# Patient Record
Sex: Female | Born: 1950 | Race: White | Hispanic: No | Marital: Married
Health system: Midwestern US, Community
[De-identification: ages and names within clinical notes are randomized; demographics above are authoritative.]

## PROBLEM LIST (undated history)

## (undated) DIAGNOSIS — N649 Disorder of breast, unspecified: Secondary | ICD-10-CM

## (undated) DIAGNOSIS — I10 Essential (primary) hypertension: Secondary | ICD-10-CM

## (undated) DIAGNOSIS — E785 Hyperlipidemia, unspecified: Secondary | ICD-10-CM

## (undated) DIAGNOSIS — C50912 Malignant neoplasm of unspecified site of left female breast: Principal | ICD-10-CM

## (undated) DIAGNOSIS — Z1231 Encounter for screening mammogram for malignant neoplasm of breast: Secondary | ICD-10-CM

## (undated) DIAGNOSIS — Z170 Estrogen receptor positive status [ER+]: Secondary | ICD-10-CM

---

## 2012-09-07 ENCOUNTER — Encounter

## 2012-09-22 NOTE — Patient Instructions (Signed)
Patient instructed to:  Bring picture ID, insurance card and proof of address  Dress comfortable  No jewelry  No makeup, lotions or powders  Can have a light breakfast the morning of surgery  Can take meds as usual the day of surgery  Patient instructed to arrive 09/27/2012 @ 1200  Verbalized understanding.  Michelle Pham

## 2012-09-27 MED ORDER — CEPHALEXIN 500 MG PO CAPS
500 MG | ORAL_CAPSULE | Freq: Four times a day (QID) | ORAL | Status: DC
Start: 2012-09-27 — End: 2013-10-11

## 2012-09-27 NOTE — Brief Op Note (Signed)
Brief Postoperative Note    Michelle Pham  Date of Birth:  1951/01/12  [id number]    Pre-operative Diagnosis: forehead bcc    Post-operative Diagnosis: Same    Procedure: excision and repair of forehead bcc    Anesthesia: Local    Surgeons/Assistants: K. Odaly Peri    Estimated Blood Loss: less than 50     Complications: None    Specimens: Was Obtained:     Findings:     Electronically signed by Prescott Gum, MD on 09/27/2012 at 2:14 PM

## 2012-09-27 NOTE — Progress Notes (Signed)
Sutures right forehead intact, no bleeding. Denies any pain.Discharge instructions reviewed with patient/responsible adult and understanding verbalized. Discharge instructions signed and copies given. Patient discharged home with belongings.

## 2012-09-27 NOTE — Plan of Care (Signed)
Pre-Operative:  1.  The patient is free from signs and symptoms of injury.  2.  The patient/caregiver participates in decisions affecting his or her Perioperative plan      of care.  3.  The patient's right to privacy is maintained.  4.  The Patient is the recipient of competent and ethical care within legal standards of      practice.  5.  The patient/caregiver has reduced anxiety.  Interventions - Familiarize with      environment and equipment.  6. Patient understands signs and symptoms of surgical site infections and verbalizes importance of hand hygiene.  7. The patient is assumed to be at increased risk of falling due to the procedure and medication use and appropriate interventions are implemented to prevent injuries related to falls.

## 2012-09-27 NOTE — Discharge Summary (Signed)
Discussed with the patient and all questioned fully answered. She will call me if any problems arise.

## 2012-09-27 NOTE — Op Note (Signed)
PATIENT NAME:                 PA #:            MR #Michelle Pham, Michelle Pham                  [id number]       [id number]            SURGEON:                              SURG DATE:  DIS DATE:          Prescott Gum, MD                  09/27/2012                     DATE OF BIRTH:   AGE:           PATIENT TYPE:     RM #:              [birth year]       61             SDA                                     PREOPERATIVE DIAGNOSIS:  Right forehead basal cell carcinoma.     POSTOPERATIVE DIAGNOSIS: Right forehead basal cell carcinoma.     OPERATIVE PROCEDURES:  1.  Excision of right forehead basal cell carcinoma 0.8 cm in diameter.  2.  Repair of right forehead defect 2.0 cm in length, complex.     ANESTHESIA:  Local.     ESTIMATED BLOOD LOSS:  Minimal.     COMPLICATIONS:  None.     STATUS AND DIAGNOSIS:  This approximately 62 year old female with a history  of a biopsy-proven basal cell of the right forehead.  She is being taken to  the operating room today for excision and repair.     OPERATIVE PROCEDURE:  The patient is brought to the operating room and the  lesion was identified with the patient and operating room staff.  An  elliptiform excision was planned around it.  Lidocaine 1% with 1:100,000  epinephrine was infiltrated widely around the margins of the lesion.  The  patient was prepped and draped in the usual sterile manner.  Previously  marked lines were incised and carried in the subcutaneous tissue.  The  specimen was elevated off the subcutaneous tissue. The specimen was oriented,  labeled and sent for frozen section. Frozen section was returned as showing  basal cell carcinoma with clear margins.  The defect was managed by  developing advancement flaps inferiorly and superiorly with sharp dissection  in the subcutaneous plane. Hemostasis was obtained with bipolar cautery.  Flaps were advanced into the center of the defect and secured with  interrupted 6-0 Vicryl. The skin was closed with running  6-0 fast absorbing  gut.  Excellent result was achieved.  The patient was awakened and returned  to the recovery room.  She tolerated the procedure well. There were no  obvious complications.  Prescott Gum, MD     [id number]  DD: 09/27/2012 14:27   DT: 09/27/2012 14:59   Job #: [id number]  CC: Prescott Gum, MD  CC: Laurette Schimke NASH, MD  CC: SHARON Marye Round, MD

## 2012-09-27 NOTE — Discharge Instructions (Signed)
Discharge Instructions:    Clean incision site with peroxide    Return to office in 5-7 days    Call office if any problems

## 2012-09-27 NOTE — Plan of Care (Signed)
Post-Operative:  1.  The patient is at or returning to pre-op pulmonary and cardiovascular status at the      conclusion of the immediate postoperative period.  2.  The patient is free from signs and symptoms of chemical, electrical, radiation,      positioning or transfer/transport injury.  3.  The patient/caregiver demonstrates knowledge of medication, pain, and wound      management.  4.  The patient/caregiver demonstrates knowledge of the expected responses to the      operative or invasive procedure.  5.  The patient demonstrates and/or reports adequate pain control throughout the      perioperative period.  6. Patient understands signs and symptoms of surgical site infections and verbalizes importance of hand hygiene.  7. The patient is assumed to be at increased risk of falling due to the procedure and medication use and appropriate interventions are implemented to prevent injuries related to falls.

## 2012-09-27 NOTE — H&P (Signed)
HISTORY & PHYSICAL FOR LOCAL CASES    History of present illness:  Forehead BCC    All allergies & meds reviewed  RELEVANT EXAMS:  Airway:  normal    Pulmonary: normal    Cardiovascular: normal    Abdominal: normal    Specific to procedure: Forehead BCC    I have reviewed with the patient and/or family the risks, benefits and alternatives to the procedure.  ASA Class:  1    The patient condition is acceptable for planned local anesthesia:

## 2012-09-27 NOTE — Progress Notes (Signed)
Pre and post operative expectations and routines explained to patient, verbalized understanding.

## 2013-02-03 MED ORDER — ANASTROZOLE 1 MG PO TABS
1 MG | ORAL_TABLET | Freq: Every day | ORAL | Status: DC
Start: 2013-02-03 — End: 2013-08-03

## 2013-02-03 NOTE — Telephone Encounter (Signed)
Rx e-scribed to requested pharmacy.

## 2013-02-04 NOTE — Telephone Encounter (Signed)
Michelle Pham  July 02, 1950  02/04/2013    Drug name: Anastrozole   Dose: 1 mg    Oral medications symptoms/toxicities assessed and addressed: Pt c/o hot flashes but states they are manageable and she does not require intervention at this point.    Oral Medication adherence assessed:    Patient knows the name and dose of the medication-yes  Patient knows proper schedule for taking medication-yes  Patient stores medication properly-yes  Patient has missed doses of the medication-no  Patient knows when a refill of the medication is due- yes  Patient is taking new medications-no      Oral medication education provided:    Patient instructed on dose/name of medication-yes  Patient instructed on prescribed schedule of medication-yes  Discussed proper storage and handling of the medication-yes  Discussed potential medication interactions with the patient-yes  Discussed when the next medication refill is needed-yes    Oral medication stopped:    Continues with oral therapy

## 2013-04-12 ENCOUNTER — Encounter

## 2013-06-10 NOTE — Progress Notes (Signed)
ONCOLOGY FOLLOW-UP:       Primary Oncologist: Tkai Large    PROBLEM LIST:       Patient Active Problem List    Diagnosis Date Noted   ??? 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     TREATMENT PLAN:  Excision, local radiation therapy, and anastrozole started for five years, April 2014 until April 2019.          Breast cancer ([place])    03/12/2012 Initial Diagnosis Breast cancer ([place])     INTERVAL HISTORY:       No complaints about anastrozole, but has gained weight in the winter due to over eating and lack of exercise. Hoping to be more active now that weather is better.  ROS: No HA, chest pain, SOB,  abd pain, skeletal pain. 10 point review is otherwise unremarkable.  PHYSICAL EXAM:       BP 122/80    Pulse 76    Temp(Src) 98.8 ??F (37.1 ??C) (Oral)    Resp 14    Wt 182 lb 3.2 oz (82.645 kg)     General appearance: alert and cooperative  Head: Normocephalic, without obvious abnormality, atraumatic  Neck: No palpable lymphadenopathy in supraclavicular or cervical chains  Lungs: Clear to auscultation bilaterally, no audible rales, wheezes or crackles  Heart: Regular rate and rhythm, S1, S2 normal  Breast:R-unremarkable L-excellent cosmetic result, no mass, axilla neg, FROM, no edema  Abdomen: Soft, non-tender; bowel sounds normal; no masses,  no organomegaly  Extremities: without cyanosis, clubbing, edema or asymmetry,FROM, no lymphedema  Skin: No jaundice, purpura or petechiae    LABS:     CBC:   No results found for this basename: WBC, NEUTROABS, HGB, MCV, PLT        BMP:   No results found for this basename: NA, K, CHLORIDE, CO2, GLU, BUN, CREATININE, MG, TSH       HEPATIC:  No results found for this basename: AST, ALT, ALKPHOS, PROT, ALB, BILITOT, BILIDIR, GGT, URICACID, LDH       TUMOR MARKERS:   No results found for this basename: PSA, CEA, CA125, CA2729, CA199         IMAGING:     No results found.    ASSESSMENT AND PLAN:         Encounter Diagnoses   Name Primary?   ??? Breast cancer ([place]) Yes     No orders of the defined types were placed in this encounter.   Encouraged to resume exercise.  Continue on anastrozole.  Sees Dr. Darreld Mclean yearly in Dec.  See me in 6 months.    Lorain Childes

## 2013-08-01 NOTE — Telephone Encounter (Signed)
Michelle Pham  07-22-1950  08/01/2013    Drug name: Anastrozole  Dose: 1 mg     Oral medications symptoms/toxicities assessed and addressed: Left a message for [name] to return a call to the office to discuss medication regimen.    Oral Medication adherence assessed:    Patient knows the name and dose of the medication  Patient knows proper schedule for taking medication  Patient stores medication properly  Patient has missed doses of the medication  Patient knows when a refill of the medication is due  Patient is taking new medications      Oral medication education provided:    Patient instructed on dose/name of medication-  Patient instructed on prescribed schedule of medication  Discussed proper storage and handling of the medication  Discussed potential medication interactions with the patient  Discussed when the next medication refill is needed    Oral medication stopped:    Patient continues with oral therapy unless otherwise notified.

## 2013-08-03 MED ORDER — ANASTROZOLE 1 MG PO TABS
1 MG | ORAL_TABLET | ORAL | Status: DC
Start: 2013-08-03 — End: 2014-01-10

## 2013-08-03 NOTE — Telephone Encounter (Signed)
Anastrozole refilled per request. Last office note indicated patient is to continue with medication regimen.

## 2013-09-02 LAB — POCT URINALYSIS DIPSTICK
Bilirubin, UA: NEGATIVE
Blood, UA POC: NEGATIVE
Glucose, UA POC: NEGATIVE
Leukocytes, UA: NEGATIVE
Nitrite, UA: NEGATIVE
Spec Grav, UA: 1.03
Urobilinogen, UA: NEGATIVE
pH, UA: 5

## 2013-09-02 MED ORDER — HYDROCODONE-ACETAMINOPHEN 5-325 MG PO TABS
5-325 MG | ORAL_TABLET | Freq: Four times a day (QID) | ORAL | Status: DC | PRN
Start: 2013-09-02 — End: 2013-10-11

## 2013-09-02 MED ORDER — METHOCARBAMOL 750 MG PO TABS
750 MG | ORAL_TABLET | Freq: Three times a day (TID) | ORAL | Status: DC | PRN
Start: 2013-09-02 — End: 2013-10-11

## 2013-09-02 MED ORDER — NAPROXEN 500 MG PO TABS
500 MG | ORAL_TABLET | Freq: Two times a day (BID) | ORAL | Status: DC | PRN
Start: 2013-09-02 — End: 2013-10-11

## 2013-09-02 MED ADMIN — ketorolac (TORADOL) injection 60 mg: 60 mg | INTRAMUSCULAR | @ 19:00:00

## 2013-09-02 NOTE — Patient Instructions (Signed)
RX: NAPROSYN, ROBAXIN, NORCO      Back Pain: After Your Visit  Your Care Instructions     Back pain has many possible causes. It is often related to problems with muscles and ligaments of the back. It may also be related to problems with the nerves, discs, or bones of the back. Moving, lifting, standing, sitting, or sleeping in an awkward way can strain the back. Sometimes you don't notice the injury until later. Arthritis is another common cause of back pain.  Although it may hurt a lot, back pain usually improves on its own within several weeks. Most people recover in 12 weeks or less. Using good home treatment and being careful not to stress your back can help you feel better sooner.  Follow-up care is a key part of your treatment and safety. Be sure to make and go to all appointments, and call your doctor if you are having problems. It???s also a good idea to know your test results and keep a list of the medicines you take.  How can you care for yourself at home?  ?? Sit or lie in positions that are most comfortable and reduce your pain. Try one of these positions when you lie down:  ?? Lie on your back with your knees bent and supported by large pillows.  ?? Lie on the floor with your legs on the seat of a sofa or chair.  ?? Lie on your side with your knees and hips bent and a pillow between your legs.  ?? Lie on your stomach if it does not make pain worse.  ?? Do not sit up in bed, and avoid soft couches and twisted positions. Bed rest can help relieve pain at first, but it delays healing. Avoid bed rest after the first day of back pain.  ?? Change positions every 30 minutes. If you must sit for long periods of time, take breaks from sitting. Get up and walk around, or lie in a comfortable position.  ?? Try using a heating pad on a low or medium setting for 15 to 20 minutes every 2 or 3 hours. Try a warm shower in place of one session with the heating pad.  ?? You can also try an ice pack for 10 to 15 minutes every 2 to  3 hours. Put a thin cloth between the ice pack and your skin.  ?? Take pain medicines exactly as directed.  ?? If the doctor gave you a prescription medicine for pain, take it as prescribed.  ?? If you are not taking a prescription pain medicine, ask your doctor if you can take an over-the-counter medicine.  ?? Take short walks several times a day. You can start with 5 to 10 minutes, 3 or 4 times a day, and work up to longer walks. Walk on level surfaces and avoid hills and stairs until your back is better.  ?? Return to work and other activities as soon as you can. Continued rest without activity is usually not good for your back.  ?? To prevent future back pain, do exercises to stretch and strengthen your back and stomach. Learn how to use good posture, safe lifting techniques, and proper body mechanics.  When should you call for help?  Call your doctor now or seek immediate medical care if:  ?? You have new or worsening numbness in your legs.  ?? You have new or worsening weakness in your legs. (This could make it hard to stand up.)  ??  You lose control of your bladder or bowels.  Watch closely for changes in your health, and be sure to contact your doctor if:  ?? Your pain gets worse.  ?? You are not getting better after 2 weeks.   Where can you learn more?   Go to [email or web address] and sign in to your MyChart account. Enter [phone] in the [place] box to learn more about ???Back Pain: After Your Visit.???    If you do not have an account, please click on the ???Sign Up Now??? link.     ?? 2006-2015 Healthwise, Incorporated. Care instructions adapted under license by [facility]. This care instruction is for use with your licensed healthcare professional. If you have questions about a medical condition or this instruction, always ask your healthcare professional. [place] any warranty or liability for your use of this information.  Content Version: 10.4.390249; Current  as of: February 11, 2013

## 2013-09-02 NOTE — Progress Notes (Addendum)
Subjective:      Patient ID: Michelle Pham is a 63 y.o. female.    Chief Complaint   Patient presents with   ??? Back Pain     pt states "back pain X 3 days, no known injury, just started hurting in lower back"       Back Pain  This is a recurrent problem. Episode onset: x 4 days. The problem occurs constantly. The problem has been waxing and waning since onset. The pain is present in the lumbar spine. The quality of the pain is described as aching (sharp with spasms at times). The pain is at a severity of 2/10 (at present, severe at times). The symptoms are aggravated by bending and position. Pertinent negatives include no abdominal pain, bladder incontinence, bowel incontinence, chest pain, dysuria, fever, headaches, leg pain, numbness, tingling, weakness or weight loss. Risk factors include history of cancer (history of breast cancer and skin cancer). She has tried NSAIDs and analgesics (Ibuprofen and Vicodin (leftover from another family member's Rx)) for the symptoms. The treatment provided no relief.       Review of Systems   Constitutional: Negative for fever, chills and weight loss.   Respiratory: Negative for cough, shortness of breath and wheezing.    Cardiovascular: Negative for chest pain.   Gastrointestinal: Negative for nausea, vomiting, abdominal pain, diarrhea and bowel incontinence.   Genitourinary: Negative for bladder incontinence, dysuria, urgency, frequency, hematuria and difficulty urinating.   Musculoskeletal: Positive for back pain.   Skin: Negative for rash.   Neurological: Negative for tingling, weakness, numbness and headaches.       Patient's medications, allergies, past medical, surgical, social and family histories were reviewed and updated as appropriate.    Objective:   Physical Exam   Constitutional: She is oriented to person, place, and time. She appears well-developed and well-nourished. She appears distressed (discomfort noted with position changes).   Neck: Normal range of motion.    Cardiovascular: Normal rate, regular rhythm and normal heart sounds.  Exam reveals no gallop and no friction rub.    No murmur heard.  Pulmonary/Chest: Effort normal and breath sounds normal. No respiratory distress. She has no decreased breath sounds. She has no wheezes. She has no rhonchi. She has no rales. She exhibits no tenderness.   Abdominal: Soft. Bowel sounds are normal. She exhibits no distension and no mass. There is no hepatosplenomegaly. There is no tenderness. There is no rebound and no guarding.   Musculoskeletal: She exhibits no edema.        Lumbar back: She exhibits decreased range of motion and tenderness. She exhibits no bony tenderness, no edema and no deformity.        Back:    Neurological: She is alert and oriented to person, place, and time. She has normal strength. No sensory deficit. Gait (antalgic) abnormal.   Reflex Scores:       Patellar reflexes are 2+ on the right side and 2+ on the left side.       Achilles reflexes are 2+ on the right side and 2+ on the left side.  5/5 strength both LE's, no sensory deficits, negative SLR's.   Skin: Skin is warm and dry. No lesion and no rash noted.   Psychiatric: She has a normal mood and affect. Her behavior is normal.   Vitals reviewed.      Assessment:      1. Low back pain  Lumbar Spine X-Ray    Interpreted by: me    Findings: No fracture, + mild lumbar scoliosis and disc space narrowing at L2/L3 and L3/L4 levels      Results for POC orders placed in visit on 09/02/13   POCT URINALYSIS DIPSTICK       Result Value Ref Range    Color, UA golden      Clarity, UA clear      Glucose, UA POC neg      Bilirubin, UA neg      Ketones, UA small      Spec Grav, UA 1.030      Blood, UA POC neg      pH, UA 5.0      Protein, UA POC +      Urobilinogen, UA neg      Leukocytes, UA neg      Nitrite, UA neg         Toradol 60 mg given IM    [facility] report reviewed.  Nothing listed over the past 12 months.      Plan:      Patient Instructions     RX:  NAPROSYN, ROBAXIN, NORCO      Back Pain: After Your Visit  Your Care Instructions     Back pain has many possible causes. It is often related to problems with muscles and ligaments of the back. It may also be related to problems with the nerves, discs, or bones of the back. Moving, lifting, standing, sitting, or sleeping in an awkward way can strain the back. Sometimes you don't notice the injury until later. Arthritis is another common cause of back pain.  Although it may hurt a lot, back pain usually improves on its own within several weeks. Most people recover in 12 weeks or less. Using good home treatment and being careful not to stress your back can help you feel better sooner.  Follow-up care is a key part of your treatment and safety. Be sure to make and go to all appointments, and call your doctor if you are having problems. It???s also a good idea to know your test results and keep a list of the medicines you take.  How can you care for yourself at home?  ?? Sit or lie in positions that are most comfortable and reduce your pain. Try one of these positions when you lie down:  ?? Lie on your back with your knees bent and supported by large pillows.  ?? Lie on the floor with your legs on the seat of a sofa or chair.  ?? Lie on your side with your knees and hips bent and a pillow between your legs.  ?? Lie on your stomach if it does not make pain worse.  ?? Do not sit up in bed, and avoid soft couches and twisted positions. Bed rest can help relieve pain at first, but it delays healing. Avoid bed rest after the first day of back pain.  ?? Change positions every 30 minutes. If you must sit for long periods of time, take breaks from sitting. Get up and walk around, or lie in a comfortable position.  ?? Try using a heating pad on a low or medium setting for 15 to 20 minutes every 2 or 3 hours. Try a warm shower in place of one session with the heating pad.  ?? You can also try an ice pack for 10 to 15 minutes every 2 to 3  hours. Put  a thin cloth between the ice pack and your skin.  ?? Take pain medicines exactly as directed.  ?? If the doctor gave you a prescription medicine for pain, take it as prescribed.  ?? If you are not taking a prescription pain medicine, ask your doctor if you can take an over-the-counter medicine.  ?? Take short walks several times a day. You can start with 5 to 10 minutes, 3 or 4 times a day, and work up to longer walks. Walk on level surfaces and avoid hills and stairs until your back is better.  ?? Return to work and other activities as soon as you can. Continued rest without activity is usually not good for your back.  ?? To prevent future back pain, do exercises to stretch and strengthen your back and stomach. Learn how to use good posture, safe lifting techniques, and proper body mechanics.  When should you call for help?  Call your doctor now or seek immediate medical care if:  ?? You have new or worsening numbness in your legs.  ?? You have new or worsening weakness in your legs. (This could make it hard to stand up.)  ?? You lose control of your bladder or bowels.  Watch closely for changes in your health, and be sure to contact your doctor if:  ?? Your pain gets worse.  ?? You are not getting better after 2 weeks.   Where can you learn more?   Go to [email or web address] and sign in to your MyChart account. Enter [phone] in the [facility] box to learn more about ???Back Pain: After Your Visit.???    If you do not have an account, please click on the ???Sign Up Now??? link.     ?? 2006-2015 Healthwise, Incorporated. Care instructions adapted under license by [facility]. This care instruction is for use with your licensed healthcare professional. If you have questions about a medical condition or this instruction, always ask your healthcare professional. [place] any warranty or liability for your use of this information.  Content Version: 10.4.390249; Current as  of: February 11, 2013                           09/06/2013 @ 0820:  Patient called and requested a refill on muscle relaxer.  I returned her call and she reports that she is feeling better today and has about 6 tablets remaining.  She will call back if she thinks she needs a refill.    Windy Fast, MD

## 2013-09-05 NOTE — Telephone Encounter (Signed)
Pt is concerned about running out of the muscle relaxer she was given on Friday. Stated she only has 4 left, has made an appt with a PCP but cannot get in till July. Wants to know if she can get a refill.

## 2013-09-12 NOTE — Telephone Encounter (Signed)
Patient called to rescheduled her follow up appointment with the provider prior to her leaving the practice. Patient rescheduled from 09.11.15 to 08.21.15.

## 2013-10-11 LAB — POCT URINALYSIS DIPSTICK
Bilirubin, UA: NEGATIVE
Blood, UA POC: NEGATIVE
Glucose, UA POC: NEGATIVE
Ketones, UA: NEGATIVE
Leukocytes, UA: NEGATIVE
Nitrite, UA: NEGATIVE
Protein, UA POC: NEGATIVE
Spec Grav, UA: 1.02
Urobilinogen, UA: NEGATIVE
pH, UA: 5

## 2013-10-11 MED ORDER — WELCHOL 3.75 G PO PACK
3.75 g | Freq: Every day | ORAL | Status: DC
Start: 2013-10-11 — End: 2013-10-18

## 2013-10-11 NOTE — Progress Notes (Signed)
[facility]  History and Physical  Elmo Putt, M.D.        Michelle Pham  Date of Birth:  [birth year]    Date of Service:  10/11/2013    Chief Complaint:   Michelle Pham is a 63 y.o. female who presents for complete physical examination.    HPI: Here to Establish and for Annual Physical.    She complain of flare of LBP last month and it's been getting better.  She's back mowing her yard and uses Aleve prn.     Treatment Adherence:   Medication compliance:  compliant most of the time  Diet compliance:  compliant all of the time  Weight trend: increasing  Current exercise: no regular exercise but she's on her feet all day with her yard work  Barriers: back pain  Hypertension:  Home blood pressure monitoring: Yes - 120/80 stable on Lisinopril 20 mg 1/2 qd.  She is adherent to a low sodium diet. Patient denies chest pain, shortness of breath, headache, lightheadedness, blurred vision, peripheral edema, palpitations, dry cough and fatigue.  Antihypertensive medication side effects: no medication side effects noted.  Use of agents associated with hypertension: NSAIDS.    Hyperlipidemia:  Unable to take statins due to myalgia.  Trying to watch diet and exercise daily      Wt Readings from Last 3 Encounters:   10/11/13 170 lb (77.111 kg)   09/02/13 166 lb (75.297 kg)   06/10/13 182 lb 3.2 oz (82.645 kg)     BP Readings from Last 3 Encounters:   10/11/13 144/80   09/02/13 128/62   06/10/13 122/80       Patient Active Problem List   Diagnosis   ??? Breast cancer ([place])   ??? Hyperlipidemia   ??? Hypertension       Allergies   Allergen Reactions   ??? Asa [Aspirin] Other (See Comments)     Causes stomach cramping     ??? Crestor [Rosuvastatin]      Myalgia   ??? Pravastatin      myalgia   ??? Zetia [Ezetimibe] Other (See Comments)     Muscle aches   ??? Zocor [Simvastatin] Other (See Comments)     Muscle aches     Outpatient Prescriptions Marked as Taking for the 10/11/13 encounter (Office Visit) with Quinn Plowman Birgitta Uhlir, MD   Medication Sig  Dispense Refill   ??? anastrozole (ARIMIDEX) 1 MG tablet TAKE ONE TABLET BY MOUTH DAILY  30 tablet  4   ??? lisinopril (PRINIVIL;ZESTRIL) 10 MG tablet Take 10 mg by mouth daily.           Past Medical History   Diagnosis Date   ??? Hyperlipidemia    ??? Hypertension    ??? Wears glasses    ??? Skin cancer 2014     right forehead.   ??? Breast cancer [facility])      radiation - left breast cancer     Past Surgical History   Procedure Laterality Date   ??? Breast lumpectomy Left 2014   ??? Appendectomy       as tenager   ??? Cesarean section       x 2   ??? Breast biopsy Left 2014     Family History   Problem Relation Age of Onset   ??? High Blood Pressure Mother    ??? Other Mother      colitis and perforated bowel   ??? Alzheimer's Disease Mother    ???  Irritable Bowel Syndrome Mother    ??? Alcohol Abuse Father    ??? High Cholesterol Brother    ??? High Blood Pressure Brother    ??? Heart Disease Maternal Uncle      heart attack    ??? Alzheimer's Disease Maternal Grandmother    ??? High Blood Pressure Maternal Grandmother    ??? Heart Disease Maternal Grandfather      heart attack    ??? Heart Disease Maternal Uncle      heart attack    ??? Anxiety Disorder Daughter    ??? Other Paternal Grandmother      die of perforated bowel   ??? High Blood Pressure Brother    ??? High Cholesterol Brother      History     Social History   ??? Marital Status: Married     Spouse Name: N/A     Number of Children: N/A   ??? Years of Education: N/A     Occupational History   ??? Not on file.     Social History Main Topics   ??? Smoking status: Never Smoker    ??? Smokeless tobacco: Never Used   ??? Alcohol Use: No      Comment:  seldom   ??? Drug Use: No   ??? Sexual Activity:     Partners: Male     Other Topics Concern   ??? Not on file     Social History Narrative       Review of Systems:  A comprehensive review of systems was negative except for what was noted in the HPI.     Physical Exam:   Filed Vitals:    10/11/13 1317 10/11/13 1324   BP: 152/82 144/80   Pulse: 78    Temp: 98.5 ??F (36.9 ??C)    Resp:  18    Height: 5' 6.5" (1.689 m)    Weight: 170 lb (77.111 kg)      Body mass index is 27.03 kg/(m^2).   Constitutional: She is oriented to person, place, and time. She appears well-developed and well-nourished. No distress.   HEENT:   Head: Normocephalic and atraumatic.   Right Ear: Tympanic membrane, external ear and ear canal normal.   Left Ear: Tympanic membrane, external ear and ear canal normal.   Mouth/Throat: Oropharynx is clear and moist, and mucous membranes are normal.  There is no cervical adenopathy.  Eyes: Conjunctivae and extraocular motions are normal. Pupils are equal, round, and reactive to light.   Neck: Supple. No JVD present. Carotid bruit is not present. No mass and no thyromegaly present.   Cardiovascular: Normal rate, regular rhythm, normal heart sounds and intact distal pulses.  Exam reveals no gallop and no friction rub.  No murmur heard.  Pulmonary/Chest: Effort normal and breath sounds normal. No respiratory distress. She has no wheezes, rhonchi or rales.   Abdominal: Soft, non-tender. Bowel sounds and aorta are normal. She exhibits no organomegaly, mass or bruit.   Musculoskeletal: Normal range of motion, no synovitis. She exhibits no edema.   Neurological: She is alert and oriented to person, place, and time. She has normal reflexes. No cranial nerve deficit. Coordination normal.   Skin: Skin is warm and dry. There is no rash or erythema.  No suspicious lesions noted.   Psychiatric: She has a normal mood and affect. Her speech is normal and behavior is normal. Judgment, cognition and memory are normal.     EKG Interpretation: Sinus  Rhythm  WITHIN NORMAL LIMITS.    Preventive Care:  Health Maintenance   Topic Date Due   ??? FLU VACCINE YEARLY (ADULT)  11/29/2013   ??? CERVICAL CANCER SCREENING  02/05/2014   ??? BREAST CANCER SCREENING  04/12/2014   ??? COLON CANCER SCREENING COLONOSCOPY  11/24/2014   ??? TETANUS VACCINE ADULT (11 YEARS AND UP)  12/09/2015   ??? ZOSTAVAX VACCINE  Completed      Hx  abnormal PAP: no  Sexual activity: has sex with males   Self-breast exams: yes  Last eye exam: 2015, normal  Exercise: walks 6 time(s) per week  Seatbelt use: yes       Preventive plan of care for Michelle Pham        10/11/2013           Preventive Measures Status       Recommendations for screening   Colon Cancer Screen   Colonoscopy Test is due 2016   Breast Cancer Screen  Mammogram Repeat yearly   Cervical Cancer Screen   PAP smear:  Test is due this year   Osteoporosis Screen   DEXA scan  Test is due 2016   Diabetes Screen  No results found for this basename: glucose    Test recommended and ordered  Repeat yearly   Cholesterol Screen  No results found for this basename: chol, trig, hdl, ldlcalc, ldldirect    Test recommended and ordered  Repeat yearly   Aspirin for Cardiovascular Prevention   No Not indicated   Weight: Body mass index is 27.03 kg/(m^2).  5' 6.5" (1.689 m)170 lb (77.111 kg)    Your BMI is 25 or greater, which indicates that you are overweight   Living Will: Yes   Copy requested    Recommended Immunizations    Immunization History   Administered Date(s) Administered   ??? Meningococcal Vaccine, unspecified formulation 08/29/2010   ??? Tdap (Boostrix, Adacel) 12/08/2005   ??? Zoster 08/29/2010        Influenza vaccine:  recommended every fall         Other Recommendations ??   See a dentist every 6 months  ?? Try to get at least 30 minutes of exercise 3-5 days per week  ?? Always wear a seat belt when traveling in a car  ?? Always wear a helmet when riding a bicycle or motorcycle  ?? When exposed to the sun, use a sunscreen that protects against both UVA and UVB radiation with an SPF of 30 or greater- reapply every 2 to 3 hours or after sweating, drying off with a towel, or swimming  ?? You need 1200-1500 mg of calcium and 1000-2000 IU of vitamin D per day- it is possible to meet your calcium requirement with diet alone, but a vitamin D supplement is usually necessary                 Assessment/Plan:    Michelle Pham  was seen today for established new doctor.    Diagnoses and associated orders for this visit:    Annual physical exam  - Lipid Panel; Future  - Comprehensive Metabolic Panel; Future  - CBC Auto Differential; Future  - POCT Urinalysis no Micro    Hyperlipidemia  - Lipid Panel; Future  Start colesevelam [facility]) 3.75 G PACK; Take 3.75 g by mouth daily    Hypertension  - POCT Urinalysis no Micro      Return Fasting 2 months.

## 2013-10-11 NOTE — Patient Instructions (Addendum)
Preventive plan of care for Trinty Marken        10/11/2013           Preventive Measures Status       Recommendations for screening   Colon Cancer Screen   Colonoscopy Test is due 2016   Breast Cancer Screen  Mammogram Repeat yearly   Cervical Cancer Screen   PAP smear:  Test is due this year   Osteoporosis Screen   DEXA scan  Test is due 2016   Diabetes Screen  No results found for this basename: glucose    Test recommended and ordered  Repeat yearly   Cholesterol Screen  No results found for this basename: chol, trig, hdl, ldlcalc, ldldirect    Test recommended and ordered  Repeat yearly   Aspirin for Cardiovascular Prevention   No Not indicated   Weight: Body mass index is 27.03 kg/(m^2).  5' 6.5" (1.689 m)170 lb (77.111 kg)    Your BMI is 25 or greater, which indicates that you are overweight   Living Will: Yes   Copy requested    Recommended Immunizations    Immunization History   Administered Date(s) Administered   ??? Meningococcal Vaccine, unspecified formulation 08/29/2010   ??? Tdap (Boostrix, Adacel) 12/08/2005   ??? Zoster 08/29/2010        Influenza vaccine:  recommended every fall         Other Recommendations ??   See a dentist every 6 months  ?? Try to get at least 30 minutes of exercise 3-5 days per week  ?? Always wear a seat belt when traveling in a car  ?? Always wear a helmet when riding a bicycle or motorcycle  ?? When exposed to the sun, use a sunscreen that protects against both UVA and UVB radiation with an SPF of 30 or greater- reapply every 2 to 3 hours or after sweating, drying off with a towel, or swimming  ?? You need 1200-1500 mg of calcium and 1000-2000 IU of vitamin D per day- it is possible to meet your calcium requirement with diet alone, but a vitamin D supplement is usually necessary               Return Fasting 2 months.

## 2013-10-13 LAB — CBC WITH AUTO DIFFERENTIAL
Basophils %: 0.2 %
Basophils Absolute: 0 10*3/uL (ref 0.0–0.2)
Eosinophils %: 5.6 %
Eosinophils Absolute: 0.4 10*3/uL (ref 0.0–0.6)
Hematocrit: 43.7 % (ref 36.0–48.0)
Hemoglobin: 13.9 g/dL (ref 12.0–16.0)
Lymphocytes %: 16.7 %
Lymphocytes Absolute: 1.3 10*3/uL (ref 1.0–5.1)
MCH: 31.4 pg (ref 26.0–34.0)
MCHC: 31.9 g/dL (ref 31.0–36.0)
MCV: 98.5 fL (ref 80.0–100.0)
MPV: 8.5 fL (ref 5.0–10.5)
Monocytes %: 6.7 %
Monocytes Absolute: 0.5 10*3/uL (ref 0.0–1.3)
Neutrophils %: 70.8 %
Neutrophils Absolute: 5.4 10*3/uL (ref 1.7–7.7)
Platelets: 377 10*3/uL (ref 135–450)
RBC: 4.43 M/uL (ref 4.00–5.20)
RDW: 14.1 % (ref 12.4–15.4)
WBC: 7.7 10*3/uL (ref 4.0–11.0)

## 2013-10-13 LAB — LIPID PANEL
Cholesterol, Total: 262 mg/dL — ABNORMAL HIGH (ref 0–199)
HDL: 60 mg/dL (ref 40–60)
LDL Calculated: 182 mg/dL — ABNORMAL HIGH (ref <100)
Triglycerides: 102 mg/dL (ref 0–150)
VLDL Cholesterol Calculated: 20 mg/dL

## 2013-10-13 LAB — COMPREHENSIVE METABOLIC PANEL
ALT: 13 U/L (ref 10–40)
AST: 19 U/L (ref 15–37)
Albumin/Globulin Ratio: 1.6 (ref 1.1–2.2)
Albumin: 4.5 g/dL (ref 3.4–5.0)
Alkaline Phosphatase: 71 U/L (ref 40–129)
Anion Gap: 15 (ref 3–16)
BUN: 20 mg/dL (ref 7–20)
CO2: 24 mmol/L (ref 21–32)
Calcium: 9.9 mg/dL (ref 8.3–10.6)
Chloride: 99 mmol/L (ref 99–110)
Creatinine: 0.9 mg/dL (ref 0.6–1.2)
GFR African American: >60 (ref >60)
GFR Non-African American: >60 (ref >60)
Globulin: 2.9 g/dL
Glucose: 90 mg/dL (ref 70–99)
Potassium: 4.9 mmol/L (ref 3.5–5.1)
Sodium: 138 mmol/L (ref 136–145)
Total Bilirubin: 0.6 mg/dL (ref 0.0–1.0)
Total Protein: 7.4 g/dL (ref 6.4–8.2)

## 2013-10-17 NOTE — Telephone Encounter (Signed)
Please advise?

## 2013-10-17 NOTE — Telephone Encounter (Signed)
Received call from patient stating that the [facility] she is prescribed has a high copay for her. She wanted to know as an alternative if ok with Dr. Willene Hatchet, could she take Cholestyramine (Questran) instead, It is more affordable. If ok, please call in script to [facility].

## 2013-10-18 ENCOUNTER — Encounter

## 2013-10-18 MED ORDER — QUESTRAN 4 G PO PACK
4 g | Freq: Two times a day (BID) | ORAL | Status: DC
Start: 2013-10-18 — End: 2013-11-14

## 2013-10-18 NOTE — Telephone Encounter (Signed)
Inform pt Questran has been sent.

## 2013-10-18 NOTE — Telephone Encounter (Signed)
Left message to call back on  voice mail.

## 2013-10-19 NOTE — Telephone Encounter (Signed)
Patient phoned the office back and was informed Michelle Pham was sent to the pharmacy.

## 2013-11-09 NOTE — Telephone Encounter (Signed)
Patient called to reschedule their Follow Up appointment. Appointment has been rescheduled for 11/11/2013.

## 2013-11-11 NOTE — Progress Notes (Signed)
ONCOLOGY FOLLOW-UP:       Primary Oncologist: Falana Clagg    PROBLEM LIST:       Patient Active Problem List    Diagnosis Date Noted   ??? Hyperlipidemia    ??? Hypertension    ??? 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     TREATMENT PLAN:  Excision, local radiation therapy, and anastrozole started for five years, April 2014 until April 2019.          Breast cancer ([place])    03/12/2012 Initial Diagnosis Breast cancer ([place])     INTERVAL HISTORY:       No complaints about anastrozole. Joint "pain" after working in the yard all day.  ROS: No HA, chest pain, SOB,  abd pain, skeletal pain. 10 point review is otherwise unremarkable.  PHYSICAL EXAM:       BP 110/70 mmHg   Pulse 84   Temp(Src) 98.3 ??F (36.8 ??C) (Oral)   Resp 16   Ht 5' 7"  (1.702 m)   Wt 170 lb 9.6 oz (77.384 kg)   BMI 26.71 kg/m2    General appearance: alert and cooperative  Head: Normocephalic, without obvious abnormality, atraumatic  Neck: No palpable lymphadenopathy in supraclavicular or cervical chains  Lungs: Clear to auscultation bilaterally, no audible rales, wheezes or crackles  Heart: Regular rate and rhythm, S1, S2 normal  Breast:R-unremarkable L-excellent cosmetic result, no mass, axilla neg, FROM, no edema  Abdomen: Soft, non-tender; bowel sounds normal; no masses,  no organomegaly  Extremities: without cyanosis, clubbing, edema or asymmetry,FROM, no lymphedema  Skin: No jaundice, purpura or petechiae    LABS:     CBC:   Lab Results   Component Value Date    WBC 7.7 10/12/2013       BMP:    Lab Results   Component Value Date    NA 138 10/12/2013       HEPATIC:  Lab Results   Component Value Date    AST 19 10/12/2013       TUMOR MARKERS:   No results found for this basename: PSA,  CEA,  CA125,  CA2729,  CA199         IMAGING:     No results found.    ASSESSMENT AND PLAN:         No diagnosis found.  No orders of the defined types were placed in this encounter.   Encouraged to continue exercise.  Continue on anastrozole.  Sees Dr. Darreld Mclean yearly in Dec.  RTC June 2016    Elenore Paddy

## 2013-11-14 MED ORDER — CHOLESTYRAMINE 4 G PO PACK
4 g | ORAL | Status: DC
Start: 2013-11-14 — End: 2013-12-09

## 2013-11-14 NOTE — Telephone Encounter (Signed)
Next office visit   12/08/2013

## 2013-12-08 NOTE — Progress Notes (Signed)
Subjective:      Patient ID: Michelle Pham is a 63 y.o. female.    HPI  Treatment Adherence:   Medication compliance: compliant most of the time   Diet compliance: compliant all of the time   Weight trend: increasing   Current exercise: no regular exercise but she's on her feet all day with her yard work   Barriers: back pain   Hypertension: Home blood pressure monitoring: Yes - 120/80 stable on Lisinopril 20 mg 1/2 qd. She is adherent to a low sodium diet. Patient denies chest pain, shortness of breath, headache, lightheadedness, blurred vision, peripheral edema, palpitations, dry cough and fatigue. Antihypertensive medication side effects: no medication side effects noted. Use of agents associated with hypertension: NSAIDS.   Hyperlipidemia: stable on Questran 4 g bid.  Unable to take statins due to myalgia. Trying to watch diet and exercise daily.    No results found for this basename: LABA1C, LABMICR     Lab Results   Component Value Date    NA 138 10/12/2013    K 4.9 10/12/2013    CL 99 10/12/2013    CO2 24 10/12/2013    BUN 20 10/12/2013    CREATININE 0.9 10/12/2013    GLUCOSE 90 10/12/2013    CALCIUM 9.9 10/12/2013     Lab Results   Component Value Date    CHOL 262 10/12/2013    TRIG 102 10/12/2013    HDL 60 10/12/2013    LDLCALC 182 10/12/2013     Lab Results   Component Value Date    ALT 13 10/12/2013    AST 19 10/12/2013     No results found for this basename: TSH, T29free, T11free     Lab Results   Component Value Date    WBC 7.7 10/12/2013    HGB 13.9 10/12/2013    HCT 43.7 10/12/2013    MCV 98.5 10/12/2013    PLT 377 10/12/2013     No results found for this basename: INR      No results found for this basename: PSA      No results found for this basename: LABURIC          Review of Systems  was negative except of what was stated on HPI      Objective:   Physical Exam  Nursing note and vitals reviewed.    Filed Vitals:    12/08/13 1055 12/08/13 1057   BP: 150/90 144/88   Pulse: 72    Temp: 98.6 ??F (37 ??C)    Resp: 18     Height: 5\' 7"  (1.702 m)    Weight: 169 lb (76.658 kg)      Wt Readings from Last 3 Encounters:   12/08/13 169 lb (76.658 kg)   11/11/13 170 lb 9.6 oz (77.384 kg)   10/11/13 170 lb (77.111 kg)     BP Readings from Last 3 Encounters:   12/08/13 144/88   11/11/13 110/70   10/11/13 144/80     Body mass index is 26.46 kg/(m^2).  Constitutional: Patient appears well-developed and well-nourished. No distress.   Head: Normocephalic and atraumatic.   Neck: Normal range of motion. Neck supple. No thyroidmegaly.   Cardiovascular: Normal rate, regular rhythm, normal heart sounds and intact distal pulses.   Pulmonary/Chest: Effort normal and breath sounds normal. No stridor. No respiratory distress. No wheezes and no rales.   Abdominal: Soft. Bowel sounds are normal. No distension and no mass. No tenderness. No rebound and  no guarding.   Musculoskeletal: No edema and no tenderness.   Skin: No rash or erythema.  Psychiatric: Normal mood and affect. Behavior is normal.       Assessment:      Noor was seen today for 3 month follow-up.    Diagnoses and associated orders for this visit:    Hyperlipidemia  - Hepatic Function Panel  - Lipid Panel    Essential hypertension    Need for prophylactic vaccination and inoculation against influenza  - Flu Vaccine greater than or = 64yo IM             Plan:      Return Fasting 6 months cholesterol 20 mins.

## 2013-12-08 NOTE — Patient Instructions (Signed)
Return Fasting 6 months cholesterol 20 mins.

## 2013-12-09 ENCOUNTER — Encounter

## 2013-12-09 LAB — LIPID PANEL
Cholesterol, Total: 199 mg/dL (ref 0–199)
HDL: 63 mg/dL — ABNORMAL HIGH (ref 40–60)
LDL Calculated: 116 mg/dL — ABNORMAL HIGH (ref <100)
Triglycerides: 100 mg/dL (ref 0–150)
VLDL Cholesterol Calculated: 20 mg/dL

## 2013-12-09 LAB — HEPATIC FUNCTION PANEL
ALT: 58 U/L — ABNORMAL HIGH (ref 10–40)
AST: 38 U/L — ABNORMAL HIGH (ref 15–37)
Albumin: 4.1 g/dL (ref 3.4–5.0)
Alkaline Phosphatase: 84 U/L (ref 40–129)
Bilirubin, Direct: <0.2 mg/dL (ref 0.0–0.3)
Total Bilirubin: 0.5 mg/dL (ref 0.0–1.0)
Total Protein: 7.1 g/dL (ref 6.4–8.2)

## 2013-12-09 MED ORDER — CHOLESTYRAMINE 4 G PO PACK
4 g | ORAL | Status: DC
Start: 2013-12-09 — End: 2014-01-07

## 2013-12-12 NOTE — Telephone Encounter (Signed)
Dr. Willene Hatchet ordered on Friday, September 11th.

## 2013-12-13 NOTE — Telephone Encounter (Signed)
Call pharmacy to see if a duplicate since Epic shows just ordered,  if they don't have the medicine in their system, can give verbal ok for the amount of refill since it's already in Epic.  In the meantime, this refill request will be refused as a duplicate.

## 2014-01-09 MED ORDER — CHOLESTYRAMINE 4 G PO PACK
4 g | ORAL | Status: DC
Start: 2014-01-09 — End: 2014-02-07

## 2014-01-09 NOTE — Telephone Encounter (Signed)
Inform pt to walk in for repeat liver test in a few weeks before out of the 30 day supply just sent in to make sure liver tests doesn't increase more before more refills can be given.

## 2014-01-09 NOTE — Telephone Encounter (Signed)
Left message on pt voicemail to call office back. Regarding- Inform pt to walk in for repeat liver test in a few weeks before out of the 30 day supply just sent in to make sure liver tests doesn't increase more before more refills can be given.

## 2014-01-10 ENCOUNTER — Encounter

## 2014-01-10 MED ORDER — ANASTROZOLE 1 MG PO TABS
1 MG | ORAL_TABLET | ORAL | Status: DC
Start: 2014-01-10 — End: 2014-05-31

## 2014-01-25 ENCOUNTER — Encounter: Admit: 2014-01-25 | Discharge: 2014-01-25 | Payer: PRIVATE HEALTH INSURANCE

## 2014-01-25 DIAGNOSIS — R7989 Other specified abnormal findings of blood chemistry: Secondary | ICD-10-CM

## 2014-01-26 LAB — HEPATIC FUNCTION PANEL
ALT: 19 U/L (ref 10–40)
AST: 24 U/L (ref 15–37)
Albumin: 4.5 g/dL (ref 3.4–5.0)
Alkaline Phosphatase: 74 U/L (ref 40–129)
Bilirubin, Direct: <0.2 mg/dL (ref 0.0–0.3)
Total Bilirubin: 0.9 mg/dL (ref 0.0–1.0)
Total Protein: 7 g/dL (ref 6.4–8.2)

## 2014-02-07 ENCOUNTER — Telehealth

## 2014-02-07 MED ORDER — LISINOPRIL 20 MG PO TABS
20 MG | ORAL_TABLET | Freq: Every day | ORAL | Status: DC
Start: 2014-02-07 — End: 2015-01-10

## 2014-02-07 MED ORDER — CHOLESTYRAMINE 4 G PO PACK
4 g | ORAL | Status: DC
Start: 2014-02-07 — End: 2014-06-08

## 2014-02-07 NOTE — Telephone Encounter (Signed)
questran  LAST REFILL 01/09/14  LAST AMOUNT 60 no refills   Last seen 12/08/2013  Next office visit   06/08/2014     Lisinopril- we have not filled before?

## 2014-02-07 NOTE — Telephone Encounter (Signed)
Patient needs 2 refills.  Lisinopril and cholestyralime oral susp.  Lisinopril needs to be written 20 mg and would like script for 90 days.  [place], [place]

## 2014-03-07 ENCOUNTER — Encounter

## 2014-03-07 ENCOUNTER — Inpatient Hospital Stay: Admit: 2014-03-07 | Attending: Surgery

## 2014-03-07 DIAGNOSIS — Z1231 Encounter for screening mammogram for malignant neoplasm of breast: Secondary | ICD-10-CM

## 2014-05-31 ENCOUNTER — Encounter

## 2014-05-31 MED ORDER — ANASTROZOLE 1 MG PO TABS
1 MG | ORAL_TABLET | ORAL | Status: DC
Start: 2014-05-31 — End: 2014-11-30

## 2014-06-08 ENCOUNTER — Ambulatory Visit: Admit: 2014-06-08 | Discharge: 2014-06-08 | Payer: MEDICAID | Attending: Internal Medicine

## 2014-06-08 ENCOUNTER — Encounter: Attending: Internal Medicine

## 2014-06-08 DIAGNOSIS — Z0000 Encounter for general adult medical examination without abnormal findings: Secondary | ICD-10-CM

## 2014-06-08 LAB — COMPREHENSIVE METABOLIC PANEL
ALT: 11 U/L (ref 10–40)
AST: 19 U/L (ref 15–37)
Albumin/Globulin Ratio: 1.3 (ref 1.1–2.2)
Albumin: 4.1 g/dL (ref 3.4–5.0)
Alkaline Phosphatase: 73 U/L (ref 40–129)
Anion Gap: 17 — ABNORMAL HIGH (ref 3–16)
BUN: 21 mg/dL — ABNORMAL HIGH (ref 7–20)
CO2: 24 mmol/L (ref 21–32)
Calcium: 9.4 mg/dL (ref 8.3–10.6)
Chloride: 100 mmol/L (ref 99–110)
Creatinine: 0.9 mg/dL (ref 0.6–1.2)
GFR African American: >60 (ref >60)
GFR Non-African American: >60 (ref >60)
Globulin: 3.2 g/dL
Glucose: 89 mg/dL (ref 70–99)
Potassium: 5 mmol/L (ref 3.5–5.1)
Sodium: 141 mmol/L (ref 136–145)
Total Bilirubin: 0.9 mg/dL (ref 0.0–1.0)
Total Protein: 7.3 g/dL (ref 6.4–8.2)

## 2014-06-08 LAB — LIPID PANEL
Cholesterol, Total: 254 mg/dL — ABNORMAL HIGH (ref 0–199)
HDL: 63 mg/dL — ABNORMAL HIGH (ref 40–60)
LDL Calculated: 172 mg/dL — ABNORMAL HIGH (ref <100)
Triglycerides: 95 mg/dL (ref 0–150)
VLDL Cholesterol Calculated: 19 mg/dL

## 2014-06-08 MED ORDER — COLESEVELAM HCL 3.75 G PO PACK
3.75 g | Freq: Every day | ORAL | Status: DC
Start: 2014-06-08 — End: 2015-01-10

## 2014-06-08 NOTE — Patient Instructions (Addendum)
Self- Management Goals for the Patient with High Cholesterol:    It is important to have goals to work towards when you have High Cholesterol.    Below is a list of goals your doctor would like you to work towards to help control your hyperlipidemia and also maintain and improve your overall health.    Please select one of these goals to try before your next follow up visit:    Goal: I will schedule my follow-up appointment for high cholesterol at checkout desk today. I will keep the appointment or, if necessary, reschedule within a reasonable timeframe.    Guess your barriers before they happen. Everyone runs into barriers to their goals. You may already know what's going to get in your way. Write down these problems (cost? time? stress? fear?), and think of ways to get around them.     Barriers to success: medication side effects  Plan for overcoming my barriers: N/A     Confidence: 8/10  Date goal set: 06/08/14  Date goal attained:       Preventive plan of care for Michelle Pham        06/08/2014           Preventive Measures Status       Recommendations for screening   Colon Cancer Screen   Colonoscopy Test is due 10/2014   Breast Cancer Screen  Mammogram Repeat yearly   Cervical Cancer Screen   PAP smear:  Test is due later 2016   Osteoporosis Screen   DEXA scan  Test is due 2017   Diabetes Screen  GLUCOSE (mg/dL)   Date Value   10/12/2013 90    Repeat yearly   Cholesterol Screen  Lab Results   Component Value Date    CHOL 199 12/08/2013    TRIG 100 12/08/2013    HDL 63* 12/08/2013    LDLCALC 116* 12/08/2013    Test recommended and ordered   Aspirin for Cardiovascular Prevention   No Not indicated   Weight: Body mass index is 27.4 kg/(m^2).  5\' 7"  (1.702 m)175 lb (79.379 kg)    Your BMI is 25 or greater, which indicates that you are overweight   Living Will: Yes   Copy requested    Recommended Immunizations    Immunization History   Administered Date(s) Administered   ??? Influenza Virus Vaccine 12/08/2013   ???  Meningococcal Vaccine, unspecified formulation 08/29/2010   ??? Tdap (Boostrix, Adacel) 12/08/2005   ??? Zoster 08/29/2010        Influenza vaccine:  recommended every fall         Other Recommendations ??   See a dentist every 6 months  ?? Try to get at least 30 minutes of exercise 3-5 days per week  ?? Always wear a seat belt when traveling in a car  ?? Always wear a helmet when riding a bicycle or motorcycle  ?? When exposed to the sun, use a sunscreen that protects against both UVA and UVB radiation with an SPF of 30 or greater- reapply every 2 to 3 hours or after sweating, drying off with a towel, or swimming  ?? You need 1200-1500 mg of calcium and 1000-2000 IU of vitamin D per day- it is possible to meet your calcium requirement with diet alone, but a vitamin D supplement is usually necessary                 Return Fasting 1 month if on med AND Fasting  Welcome to [employer]

## 2014-06-08 NOTE — Progress Notes (Signed)
No components found for: CHLPL  Lab Results   Component Value Date    TRIG 100 12/08/2013     Lab Results   Component Value Date    HDL 63* 12/08/2013     Lab Results   Component Value Date    LDLCALC 116* 12/08/2013     Lab Results   Component Value Date    LABVLDL 20 12/08/2013       Lab Results   Component Value Date    ALT 19 01/25/2014    AST 24 01/25/2014    ALKPHOS 74 01/25/2014    BILITOT 0.9 01/25/2014           Is patient currently taking any cholesterol medications?     Yes   If yes, see med list as above    Is the patient reporting any side effects of cholesterol medications?   Yes    Is the patient taking any over the counter medications?    Yes   If yes, see med list as above    Is the patient taking a daily aspirin?    No

## 2014-06-08 NOTE — Progress Notes (Signed)
[facility]  History and Physical  Elmo Putt, M.D.        Michelle Pham  Date of Birth:  1950/09/08    Date of Service:  06/08/2014    Chief Complaint:   Michelle Pham is a 64 y.o. female who presents for complete physical examination.    HPI: Here for Annual Physical with new insurance and Follow up.    Treatment Adherence:   Medication compliance: compliant most of the time   Diet compliance: compliant all of the time   Weight trend: increasing   Current exercise: no regular exercise but she's on her feet all day with her yard work   Barriers: back pain   Hypertension: Home blood pressure monitoring: Yes - 120/80 stable on Lisinopril 20 mg 1/2 qd. She is adherent to a low sodium diet. Patient denies chest pain, shortness of breath, headache, lightheadedness, blurred vision, peripheral edema, palpitations, dry cough and fatigue. Antihypertensive medication side effects: no medication side effects noted. Use of agents associated with hypertension: NSAIDS.   Hyperlipidemia: off Questran 4 g bid due to indigestion. Unable to take statins due to myalgia. Trying to watch diet and exercise daily.      No results found for: LABA1C, LABMICR  Lab Results   Component Value Date    NA 138 10/12/2013    K 4.9 10/12/2013    CL 99 10/12/2013    CO2 24 10/12/2013    BUN 20 10/12/2013    CREATININE 0.9 10/12/2013    GLUCOSE 90 10/12/2013    CALCIUM 9.9 10/12/2013     Lab Results   Component Value Date    CHOL 199 12/08/2013    TRIG 100 12/08/2013    HDL 63 12/08/2013    LDLCALC 116 12/08/2013     Lab Results   Component Value Date    ALT 19 01/25/2014    AST 24 01/25/2014     No results found for: TSH, T4FREE  Lab Results   Component Value Date    WBC 7.7 10/12/2013    HGB 13.9 10/12/2013    HCT 43.7 10/12/2013    MCV 98.5 10/12/2013    PLT 377 10/12/2013     No results found for: INR   No results found for: PSA   No results found for: LABURIC       Wt Readings from Last 3 Encounters:   06/08/14 175 lb (79.379 kg)    12/08/13 169 lb (76.658 kg)   11/11/13 170 lb 9.6 oz (77.384 kg)     BP Readings from Last 3 Encounters:   06/08/14 138/86   12/08/13 144/88   11/11/13 110/70       Patient Active Problem List   Diagnosis   ??? Breast cancer ([place])   ??? Hyperlipidemia, unspecified   ??? Essential hypertension       Allergies   Allergen Reactions   ??? Asa [Aspirin] Other (See Comments)     Causes stomach cramping     ??? Crestor [Rosuvastatin]      Myalgia   ??? Pravastatin      myalgia   ??? Questran [Cholestyramine]      indigestion   ??? Zetia [Ezetimibe] Other (See Comments)     Muscle aches   ??? Zocor [Simvastatin] Other (See Comments)     Muscle aches     Outpatient Prescriptions Marked as Taking for the 06/08/14 encounter (Office Visit) with Quinn Plowman Nathali Vent, MD   Medication Sig Dispense Refill   ???  anastrozole (ARIMIDEX) 1 MG tablet TAKE ONE TABLET BY MOUTH DAILY 30 tablet 4   ??? lisinopril (PRINIVIL;ZESTRIL) 20 MG tablet Take 1 tablet by mouth daily 90 tablet 1       Past Medical History   Diagnosis Date   ??? Hyperlipidemia    ??? Hypertension    ??? Wears glasses    ??? Skin cancer 2014     right forehead.   ??? Breast cancer [facility])      radiation - left breast cancer     Past Surgical History   Procedure Laterality Date   ??? Breast lumpectomy Left 2014   ??? Appendectomy       as tenager   ??? Cesarean section       x 2   ??? Breast biopsy Left 2014     Family History   Problem Relation Age of Onset   ??? High Blood Pressure Mother    ??? Other Mother      colitis and perforated bowel   ??? Alzheimer's Disease Mother    ??? Irritable Bowel Syndrome Mother    ??? Alcohol Abuse Father    ??? High Cholesterol Brother    ??? High Blood Pressure Brother    ??? Heart Disease Maternal Uncle      heart attack    ??? Alzheimer's Disease Maternal Grandmother    ??? High Blood Pressure Maternal Grandmother    ??? Heart Disease Maternal Grandfather      heart attack    ??? Heart Disease Maternal Uncle      heart attack    ??? Anxiety Disorder Daughter    ??? Other Paternal Grandmother      die of  perforated bowel   ??? High Blood Pressure Brother    ??? High Cholesterol Brother      History     Social History   ??? Marital Status: Married     Spouse Name: N/A     Number of Children: N/A   ??? Years of Education: N/A     Occupational History   ??? Not on file.     Social History Main Topics   ??? Smoking status: Never Smoker    ??? Smokeless tobacco: Never Used   ??? Alcohol Use: No      Comment:  seldom   ??? Drug Use: No   ??? Sexual Activity:     Partners: Male     Other Topics Concern   ??? Not on file     Social History Narrative       Review of Systems:  A comprehensive review of systems was negative except for what was noted in the HPI.     Physical Exam:   Filed Vitals:    06/08/14 1037   BP: 138/86   Pulse: 78   Temp: 99 ??F (37.2 ??C)   Resp: 16   Height: 5\' 7"  (1.702 m)   Weight: 175 lb (79.379 kg)     Body mass index is 27.4 kg/(m^2).   Constitutional: She is oriented to person, place, and time. She appears well-developed and well-nourished. No distress.   HEENT:   Head: Normocephalic and atraumatic.   Right Ear: Tympanic membrane, external ear and ear canal normal.   Left Ear: Tympanic membrane, external ear and ear canal normal.   Mouth/Throat: Oropharynx is clear and moist, and mucous membranes are normal.  There is no cervical adenopathy.  Eyes: Conjunctivae and extraocular motions are normal. Pupils are equal,  round, and reactive to light.   Neck: Supple. No JVD present. Carotid bruit is not present. No mass and no thyromegaly present.   Cardiovascular: Normal rate, regular rhythm, normal heart sounds and intact distal pulses.  Exam reveals no gallop and no friction rub.  No murmur heard.  Pulmonary/Chest: Effort normal and breath sounds normal. No respiratory distress. She has no wheezes, rhonchi or rales.   Abdominal: Soft, non-tender. Bowel sounds and aorta are normal. She exhibits no organomegaly, mass or bruit.   Musculoskeletal: Normal range of motion, no synovitis. She exhibits no edema.   Neurological: She  is alert and oriented to person, place, and time. She has normal reflexes. No cranial nerve deficit. Coordination normal.   Skin: Skin is warm and dry. There is no rash or erythema.  No suspicious lesions noted.   Psychiatric: She has a normal mood and affect. Her speech is normal and behavior is normal. Judgment, cognition and memory are normal.     Preventive Care:  Health Maintenance   Topic Date Due   ??? CERVICAL CANCER SCREENING  02/05/2014   ??? FLU VACCINE YEARLY (ADULT)  10/30/2014   ??? COLON CANCER SCREENING COLONOSCOPY  11/24/2014   ??? BREAST CANCER SCREENING  03/08/2015   ??? TETANUS VACCINE ADULT (11 YEARS AND UP)  12/09/2015   ??? ZOSTAVAX VACCINE  Completed      Hx abnormal PAP: no  Sexual activity: has sex with males   Self-breast exams: yes  Last eye exam: 2015, normal  Exercise: walks 1 time(s) per week  Seatbelt use: yes       Preventive plan of care for Michelle Pham        06/08/2014           Preventive Measures Status       Recommendations for screening   Colon Cancer Screen   Colonoscopy Test is due 10/2014   Breast Cancer Screen  Mammogram Repeat yearly   Cervical Cancer Screen   PAP smear:  Test is due later 2016   Osteoporosis Screen   DEXA scan  Test is due 2017   Diabetes Screen  GLUCOSE (mg/dL)   Date Value   10/12/2013 90    Repeat yearly   Cholesterol Screen  Lab Results   Component Value Date    CHOL 199 12/08/2013    TRIG 100 12/08/2013    HDL 63* 12/08/2013    LDLCALC 116* 12/08/2013    Test recommended and ordered   Aspirin for Cardiovascular Prevention   No Not indicated   Weight: Body mass index is 27.4 kg/(m^2).  5\' 7"  (1.702 m)175 lb (79.379 kg)    Your BMI is 25 or greater, which indicates that you are overweight   Living Will: Yes   Copy requested    Recommended Immunizations    Immunization History   Administered Date(s) Administered   ??? Influenza Virus Vaccine 12/08/2013   ??? Meningococcal Vaccine, unspecified formulation 08/29/2010   ??? Tdap (Boostrix, Adacel) 12/08/2005   ??? Zoster  08/29/2010        Influenza vaccine:  recommended every fall         Other Recommendations ??   See a dentist every 6 months  ?? Try to get at least 30 minutes of exercise 3-5 days per week  ?? Always wear a seat belt when traveling in a car  ?? Always wear a helmet when riding a bicycle or motorcycle  ?? When exposed to the sun, use  a sunscreen that protects against both UVA and UVB radiation with an SPF of 30 or greater- reapply every 2 to 3 hours or after sweating, drying off with a towel, or swimming  ?? You need 1200-1500 mg of calcium and 1000-2000 IU of vitamin D per day- it is possible to meet your calcium requirement with diet alone, but a vitamin D supplement is usually necessary                 Assessment/Plan:    Elisandra was seen today for annual exam.    Diagnoses and associated orders for this visit:    Annual physical exam  - Lipid Panel  - Comprehensive Metabolic Panel    Hyperlipidemia, unspecified  START colesevelam (WELCHOL) 3.75 G PACK; Take 3.75 g by mouth daily and prefer not to be on med if can't tolerate or too expensive.  She understands increase risk of MI or CVA  - Lipid Panel  - Comprehensive Metabolic Panel    Essential hypertension      Return Fasting 1 month if on med AND Fasting Welcome to [facility] Sept.

## 2014-06-09 ENCOUNTER — Encounter: Attending: Internal Medicine

## 2014-07-17 ENCOUNTER — Encounter: Attending: Internal Medicine

## 2014-07-24 NOTE — Telephone Encounter (Signed)
LAST REFILL 02/08/15  LAST AMOUNT 90         1 REFILLS   Last seen 06/08/2014  Next office visit   12/21/2014

## 2014-07-25 NOTE — Telephone Encounter (Signed)
Patient returned call :   She is only taking 1/2 on the Lisinopril . She does not need a refill a this time. She will call the pharmacy when she is due.   She is not taking the Welolchol / She said it made her stomach upset.

## 2014-07-25 NOTE — Telephone Encounter (Signed)
LM for pt that she should have enough Lisinopril since she only use 1/2 and if she uses more, let me know since refill currently has been decline.  Also asked pt to call back to see if she's on Wellchol and if so need to schedule for a fasting appointment ASAP before out of pills.

## 2014-09-15 ENCOUNTER — Encounter: Attending: Hematology & Oncology

## 2014-11-22 NOTE — Telephone Encounter (Signed)
Patient scheduled for 8.31.16 for a Doctor follow up and renew her Arimidex medication.

## 2014-11-29 ENCOUNTER — Ambulatory Visit: Admit: 2014-11-29 | Discharge: 2014-11-29 | Payer: PRIVATE HEALTH INSURANCE | Attending: Hematology & Oncology

## 2014-11-29 DIAGNOSIS — C50112 Malignant neoplasm of central portion of left female breast: Secondary | ICD-10-CM

## 2014-11-29 NOTE — Telephone Encounter (Signed)
Noted.  This number is listed in her chart.

## 2014-11-29 NOTE — Telephone Encounter (Signed)
Patient called regarding FYI for Clinical Staff; Patient requests that reminder calls for appointments go to her home number, [phone].Marland Kitchen Patient may be reached at [phone].

## 2014-11-29 NOTE — Progress Notes (Signed)
Oral Chemo Monitoring    Drug Name: Anastrozole  Actual Start Date:  n    Oral medication symptoms/toxicities assessed and addressed: No symptoms reported    Oral Medication adherence assessed: Patient has no complaints at this time.  Patient knows name and dose of prescribed medication: Yes  Patient stores medication properly: Yes  Patient has missed doses of medications: No  Patient knows when next refill is: Yes  Patient has started taking new medications: No    Oral medication adherence addressed:   Patient instructed on dose of medication: No  Patient instructed on prescribed schedule of the medication: No  Discussed proper storage and handling of the medication with patient: No  Discussed potential medication interactions with the patient: No  Discussed when the next medication refill is due with the patient: No    Oral medication stopped: No  Date stopped: NA  Reason if stopped:  NA

## 2014-11-29 NOTE — Progress Notes (Signed)
ONCOLOGY FOLLOW-UP:       Primary Oncologist: Yvone Slape  PCP: MONICA Janina Mayo, MD    PROBLEM LIST:       Patient Active Problem List    Diagnosis Date Noted   ??? ER+ (estrogen receptor positive status) 09/14/2014   ??? Hyperlipidemia, unspecified    ??? Essential hypertension    ??? Primary malignant neoplasm of central portion of left breast in female [facility]) 03/12/2012       Primary malignant neoplasm of central portion of left breast in female [facility])    Staging form: Breast, AJCC 7th Edition      Pathologic stage from 04/21/2012: Stage Unknown (T1a, NX, cM0) - Unsigned        Prognostic indicators: ER + PR - Her 2 neg        Oncology History    Breast cancer:  1. Screening mammogram, 02/02/2012, showed increased calcifications in the left subareolar position.    2. Diagnostic mammogram, 02/04/2012, was read as BI-RADS 4.  Because of the anterior position of the abnormality, a stereotactic biopsy could not be performed.    3. Left breast lumpectomy, 04/21/2012, with Dr. Imagene Gurney.  LNs not evaluated.  Pathology showed a 3 mm grade 1 IDC, ER pos, PR neg, HER2 neg, Ki-67 prolif rate low.    4. Adjuvant RT with Dr. Felisa Bonier between 3/4 - 06/28/2012.    5. Started Anastrozole 06/2012.  Plan was for 5 years.      BMD:  1. DEXA, 09/24/2012 showed normal BMD.        Primary malignant neoplasm of central portion of left breast in female [facility])    03/12/2012 Initial Diagnosis    Breast cancer [facility])         INTERVAL HISTORY:       Ms. Michelle Pham is a 64 y.o. female that is being seen today for 3 mm grade 1 IDC of the left breast, ER pos, PR neg, HER2 neg.    No new lumps or bumps.  She feels well.    ROS:     ?? Constitutional: Denies fever, sweats, weight loss     ?? Eyes: No visual changes or diplopia. No scleral icterus.  ?? ENT: No Headaches, hearing loss or vertigo. No mouth sores or sore throat.  ?? Cardiovascular: No chest pain, dyspnea on exertion, palpitations or loss of consciousness.   ?? Respiratory: No cough or wheezing, no sputum  production. No hemoptysis.  ?? Gastrointestinal: No abdominal pain, appetite loss, blood in stools. No change in bowel habits.  ?? Genitourinary: No dysuria, trouble voiding, or hematuria.  ?? Musculoskeletal: no generalized weakness. No joint complaints.  ?? Integumentary: No rash or pruritis.  ?? Neurological: No headache, diplopia. No change in gait, balance, or coordination. No paresthesias.  ?? Endocrine: No temperature intolerance. No excessive thirst, fluid intake, or urination.   ?? Hematologic/Lymphatic: No abnormal bruising or ecchymoses, blood clots or swollen lymph nodes.  ?? Allergic/Immunologic: No nasal congestion or hives.    CURRENT MEDS:       Current Outpatient Prescriptions   Medication Sig Dispense Refill   ??? Calcium Carbonate-Vit D-Min (CALCIUM 1200) 1200-1000 MG-UNIT CHEW Take by mouth     ??? anastrozole (ARIMIDEX) 1 MG tablet TAKE ONE TABLET BY MOUTH DAILY 30 tablet 4   ??? lisinopril (PRINIVIL;ZESTRIL) 20 MG tablet Take 1 tablet by mouth daily 90 tablet 1   ??? colesevelam (WELCHOL) 3.75 G PACK Take 3.75 g by mouth daily 30  each 0     No current facility-administered medications for this visit.        PHYSICAL EXAM:       Visit Vitals   ??? BP 120/70 (Site: Right Arm, Position: Sitting, Cuff Size: Medium Adult)   ??? Pulse 84   ??? Temp 98.5 ??F (36.9 ??C) (Oral)   ??? Resp 16   ??? Ht 5' 6"  (1.676 m)   ??? Wt 173 lb (78.5 kg)   ??? BMI 27.92 kg/m2       General appearance: alert and cooperative  Head: Normocephalic, without obvious abnormality, atraumatic  Neck: No palpable lymphadenopathy in supraclavicular or cervical chains  Lungs: Clear to auscultation bilaterally, no audible rales, wheezes or crackles  Heart: Regular rate and rhythm, S1, S2 normal  Breast: Bilateral breast exam performed. Right breast WNL, Left Breast WNL. No axillary lymphadenopathy   Abdomen: Soft, non-tender; bowel sounds normal; no masses,  no organomegaly  Extremities: without cyanosis, clubbing, edema or asymmetry,FROM, no lymphedema  Skin:  No jaundice, purpura or petechiae    LABS:     CBC:  No results for input(s): WBC, HGB, HCT, MCV, PLT, GRAN, GRANULOCYTES in the last 720 hours.    Invalid input(s): CA    BMP:  No results for input(s): NA, K, CO2, BUN, CREATININE, MG, TSH in the last 720 hours.    Invalid input(s): CHLORIDE, GLU    HEPATIC:  No results for input(s): AST, ALT, ALKPHOS, PROT, ALB, BILITOT, BILIDIR, GGT, URICACID, LDH in the last 720 hours.    TUMOR MARKERS:  No results for input(s): PSA, CEA, CA125, CA2729, CA199 in the last 720 hours.      IMAGING:     Bilateral mammogram (03/07/2014):  Impression   IMPRESSION:   Post lumpectomy changes left breast. ??No evidence malignancy.   ??   BIRADS:   BIRADS - CATEGORY 2   ??   Benign, no evidence of malignancy. ??Normal interval follow-up is recommended   in 12 months.   ??   OVERALL ASSESSMENT - BENIGN   ??   A letter of notification will be sent to the patient regarding the results.   ??   The [employer] recommends annual mammograms for women 40   years and older.   ??     ASSESSMENT AND PLAN:       1. Left-sided grade 1 IDC - Status post a left breast lumpectomy, 04/21/2012, with Dr. Imagene Gurney.  LNs not evaluated.  She had a 3 mm grade 1 IDC, ER pos, PR neg, HER2 neg, Ki-67 prolif rate low.  Adjuvant RT with Dr. Felisa Bonier between 3/4 - 06/28/2012.  Started Anastrozole 06/2012.  Plan has been for 5 years of endocrine therapy.  NED.    2. BMD - DEXA, 09/24/2012 showed normal BMD.    3. RTO in June, 2017.    Glynda Jaeger, MD  Medical Oncology/Hematology    [facility]  [address] [place] [address]  Phone: [phone]  Fax: [phone]

## 2014-11-30 ENCOUNTER — Telehealth

## 2014-11-30 MED ORDER — ANASTROZOLE 1 MG PO TABS
1 MG | ORAL_TABLET | ORAL | 3 refills | Status: AC
Start: 2014-11-30 — End: ?

## 2014-11-30 NOTE — Telephone Encounter (Signed)
Patient called regarding medication refill for anastrozole (ARIMIDEX), 1mg , 90; The patient is requesting for prescription to be sent to [facility], the phone number is [phone]. Requesting for mail order pharmacy per the patient's new insurance. Patient is also requesting for a quantity of 90.Marland Kitchen Patient may be reached at [phone].

## 2014-11-30 NOTE — Telephone Encounter (Signed)
E-scribed.

## 2014-12-21 ENCOUNTER — Encounter: Attending: Internal Medicine

## 2015-01-10 ENCOUNTER — Ambulatory Visit: Admit: 2015-01-10 | Discharge: 2015-01-10 | Payer: PRIVATE HEALTH INSURANCE | Attending: Internal Medicine

## 2015-01-10 DIAGNOSIS — Z0000 Encounter for general adult medical examination without abnormal findings: Secondary | ICD-10-CM

## 2015-01-10 LAB — POCT OCCULT BLOOD STOOL COLON CA SCREEN 1-3: Occult Blood Fecal: NEGATIVE

## 2015-01-10 LAB — POCT URINALYSIS DIPSTICK
Bilirubin, UA: NEGATIVE
Blood, UA POC: NEGATIVE
Glucose, UA POC: NEGATIVE
Ketones, UA: NEGATIVE
Leukocytes, UA: NEGATIVE
Nitrite, UA: NEGATIVE
Protein, UA POC: NEGATIVE
Spec Grav, UA: 1.02
Urobilinogen, UA: NEGATIVE
pH, UA: 6.5

## 2015-01-10 MED ORDER — LISINOPRIL 10 MG PO TABS
10 MG | ORAL_TABLET | Freq: Every day | ORAL | 1 refills | Status: DC
Start: 2015-01-10 — End: 2015-07-11

## 2015-01-10 NOTE — Progress Notes (Signed)
Medicare AWV MA Note:  Workflow and Risk Assessment    Review Medicare Health Risk Assessment form completed by patient  Document vitals, height included (3 vital signs minimum)      Update Allergies and Medications  Update Family and Social History (HRA #2-4)  Update Health Maintenance (HRA #7-13)  Update Vaccines in Historical Immunizations (HRA #9-10)  Update Patient Care Team- in [employer] [place]14[place]     Has patient seen an eye specialist within the past 2 years (HRA #7)? yes  Has patient seen a dentist within the past year (HRA #11)? yes    Medical Suppliers Used (diabetic supplies, 02, medical equipment, etc.) (HRA #14):      End of Life Planning (HRA # 15):  Advanced Directive:  yes,       Behavioral Risks     Alcohol use:  Add up scores of alcohol use questions (HRA # 3-5)- positive result is 3 or above for women and 4 or above for men.  If positive, select "alcohol use screen positive" below.  Physical activity screen:  If patient responded "no" to HRA question #16, select "inadequate physical activity" below.  Nutritional screen:  Body mass index is 27.76 kg/(m^2).Marland Kitchen  If patient responded "yes" to HRA question #17, or BMI <18 or >30, select "at risk for nutritional issues" below.      BEHAVIORAL RISKS IDENTIFIED:   None identified    Psychosocial Risks     Anxiety screen:  Add up scores of anxiety questions (HRA #18-19)- if result is 3 or above, or if patient has current or past diagnosis of anxiety, select "at risk for anxiety/stress issues" below.  Depression screen:  Add up scores of PHQ-2 (HRA #20-21): if result is 3 or above, or if patient has current or past diagnosis of depression, select "at risk for depression" below.  Social/Emotional support screen:  If patient responded "sometimes," "rarely" or "never" to HRA question #22, select "at risk for social isolation" below.  Pain screen:  If patient responded "a lot" to HRA question #24, select "at risk for pain issues" below.     PSYCHOSOCIAL RISKS  IDENTIFIED:   None identified    Cognitive Screening     Mini-Cog Screen:  Give patient the following words to remember and ask to repeat- advise patient that she will be asked to recall items later:  ORANGE, SHOES, TABLE  Clock Drawing Test (CDT): Have patient draw the face of a clock and put the numbers in the correct positions. Then draw in the hands at ten minutes after eleven.  Score clock drawing test as "normal" if the patient places the correct time and the clock appears grossly normal, or "abnormal."  The patient is then asked to recall the three words. (This should be done after one minute, or after the Clock Drawing Test is complete)    Mini-Cog Scoring: 3/3 words recalled (if score is 3, do not need to score CDT);  CDT: Normal     MINI-COG INTERPRETATION: 3- Negative screen for dementia    Functional/Safety Risks     Living Situation (HRA #23): alone   Hearing (HRA #25):  Does patient or do others report hearing difficulties? yes.    Safety Issues (HRA #26-31): Any "no" responses? no  Difficulty with ADLs (HRA #32-34): None identified  Fall Risk:  Perform Get Up & Go test- ask patient to stand from seated position, walk forward 10 feet, turn around, and walk back to chair and sit down-  note time to complete test.  Assistive devices are allowed.    Was the patient???s timed Get Up & Go test unsteady or longer than 20 seconds or falls reported per HRA? no  Visual Screening:    Right: 20/, Left:  20/, Both:  20/    SAFETY RISKS IDENTIFIED:   Hearing problems    Scan [facility] form into media section of chart.

## 2015-01-10 NOTE — Progress Notes (Addendum)
Annual GYN Visit      Michelle Pham  Date of Birth:  05-28-1950    Date of Service:  01/10/2015    Chief Complaint:   Michelle Pham is a 64 y.o. female who presents for routine annual gynecologic visit.    HPI:  Patient is postmenopausal- No LMP recorded. Patient is postmenopausal..  She denies pelvic pain, genital ulcers, vaginal discharge, vulvar/vaginal itching, vulvar/vaginal irritation/pain, urinary frequency, urinary urgency and urinary incontinence.  Menopausal/perimenopausal symptoms: none.  Hormone therapy side effects: none, not applicable.    Treatment Adherence:   Medication compliance: compliant most of the time   Diet compliance: compliant all of the time   Weight trend: increasing   Current exercise: no regular exercise but she's on her feet all day with her yard work   Barriers: back pain   Hypertension: Home blood pressure monitoring: Yes - 120/80 stable on Lisinopril 10 mg 1 qd. She is adherent to a low sodium diet. Patient denies chest pain, shortness of breath, headache, lightheadedness, blurred vision, peripheral edema, palpitations, dry cough and fatigue. Antihypertensive medication side effects: no medication side effects noted. Use of agents associated with hypertension: NSAIDS.   Hyperlipidemia: off Questran 4 g bid due to indigestion and Welchol is too expensive. Unable to take statins due to myalgia. Trying to watch diet and exercise daily.    No results found for: LABA1C, LABMICR  Lab Results   Component Value Date    NA 141 06/08/2014    K 5.0 06/08/2014    CL 100 06/08/2014    CO2 24 06/08/2014    BUN 21 (H) 06/08/2014    CREATININE 0.9 06/08/2014    GLUCOSE 89 06/08/2014    CALCIUM 9.4 06/08/2014     Lab Results   Component Value Date    CHOL 254 06/08/2014    TRIG 95 06/08/2014    HDL 63 06/08/2014    LDLCALC 172 06/08/2014     Lab Results   Component Value Date    ALT 11 06/08/2014    AST 19 06/08/2014     No results found for: TSH, T4FREE  Lab Results   Component Value Date    WBC 7.7  10/12/2013    HGB 13.9 10/12/2013    HCT 43.7 10/12/2013    MCV 98.5 10/12/2013    PLT 377 10/12/2013     No results found for: INR   No results found for: PSA   No results found for: [facility]       Patient Active Problem List   Diagnosis   ??? Primary malignant neoplasm of central portion of left breast in female [facility])   ??? Hyperlipidemia, unspecified   ??? Essential hypertension   ??? ER+ (estrogen receptor positive status)       Allergies   Allergen Reactions   ??? Asa [Aspirin] Other (See Comments)     Causes stomach cramping     ??? Crestor [Rosuvastatin]      Myalgia   ??? Pravastatin      myalgia   ??? Questran [Cholestyramine]      indigestion   ??? Zetia [Ezetimibe] Other (See Comments)     Muscle aches   ??? Zocor [Simvastatin] Other (See Comments)     Muscle aches     Outpatient Prescriptions Marked as Taking for the 01/10/15 encounter (Office Visit) with Michelle Plowman Tracee Mccreery, MD   Medication Sig Dispense Refill   ??? anastrozole (ARIMIDEX) 1 MG tablet TAKE ONE TABLET BY MOUTH  DAILY 90 tablet 3   ??? Calcium Carbonate-Vit D-Min (CALCIUM 1200) 1200-1000 MG-UNIT CHEW Take by mouth     ??? colesevelam (WELCHOL) 3.75 G PACK Take 3.75 g by mouth daily 30 each 0   ??? lisinopril (PRINIVIL;ZESTRIL) 20 MG tablet Take 1 tablet by mouth daily 90 tablet 1       Past Medical History   Diagnosis Date   ??? Breast cancer ([place])      radiation - left breast cancer   ??? Hyperlipidemia    ??? Hypertension    ??? Skin cancer 2014     right forehead.   ??? Wears glasses      Past Surgical History   Procedure Laterality Date   ??? Breast lumpectomy Left 2014   ??? Appendectomy       as tenager   ??? Cesarean section       x 2   ??? Breast biopsy Left 2014     Family History   Problem Relation Age of Onset   ??? High Blood Pressure Mother    ??? Other Mother      colitis and perforated bowel   ??? Alzheimer's Disease Mother    ??? Irritable Bowel Syndrome Mother    ??? Alcohol Abuse Father    ??? High Cholesterol Brother    ??? High Blood Pressure Brother    ??? Heart Disease Maternal Uncle       heart attack    ??? Alzheimer's Disease Maternal Grandmother    ??? High Blood Pressure Maternal Grandmother    ??? Heart Disease Maternal Grandfather      heart attack    ??? Heart Disease Maternal Uncle      heart attack    ??? Anxiety Disorder Daughter    ??? Other Paternal Grandmother      die of perforated bowel   ??? High Blood Pressure Brother    ??? High Cholesterol Brother      Social History     Social History   ??? Marital status: Married     Spouse name: N/A   ??? Number of children: N/A   ??? Years of education: N/A     Occupational History   ??? Not on file.     Social History Main Topics   ??? Smoking status: Never Smoker   ??? Smokeless tobacco: Never Used   ??? Alcohol use No      Comment:  seldom   ??? Drug use: No   ??? Sexual activity: Yes     Partners: Male     Other Topics Concern   ??? Not on file     Social History Narrative       Preventive Care and Risk Factor Assessment  Health Maintenance   Topic Date Due   ??? Hepatitis C screen  1950-06-30   ??? HIV screen  11/26/1964   ??? Cervical cancer screen  02/05/2014   ??? Flu vaccine (1) 10/30/2014   ??? Colon cancer screen colonoscopy  11/24/2014   ??? PNEUMO VAC >= 65 (1 of 2 - PCV13) 11/27/2014   ??? DEXA (modify frequency per FRAX score)  11/27/2014   ??? Breast cancer screen  03/08/2015   ??? DTaP/Tdap/Td vaccine (2 - Td) 12/09/2015   ??? Lipid screen  06/08/2019   ??? Zostavax vaccine  Completed      Hx abnormal PAP: no  Sexual activity: has sex with males.  Hx of STD: no  Hx of abnormal mammogram: yes -  h/o breast ca  Self-breast exams: yes  FH of breast cancer: no  FH of GYN cancer: no   Previous DEXA scan: yes- 2014, normal  Exercise: walks 4 time(s) per week  History   Smoking Status   ??? Never Smoker   Smokeless Tobacco   ??? Never Used      History   Alcohol Use No     Comment:  seldom        Immunization History   Administered Date(s) Administered   ??? Influenza Virus Vaccine 12/08/2013   ??? Meningococcal Vaccine, unspecified formulation 08/29/2010   ??? Tdap (Boostrix, Adacel) 12/08/2005   ???  Zoster 08/29/2010       Review of Systems:  A comprehensive review of systems was negative except for what was noted in the HPI.    Wt Readings from Last 3 Encounters:   01/10/15 172 lb (78 kg)   11/29/14 173 lb (78.5 kg)   06/08/14 175 lb (79.4 kg)     BP Readings from Last 3 Encounters:   01/10/15 132/84   11/29/14 120/70   06/08/14 138/86       Physical Exam:  Vitals:    01/10/15 1001   BP: 132/84   Pulse: 72   Resp: 16   Temp: 98.2 ??F (36.8 ??C)   Weight: 172 lb (78 kg)   Height: 5\' 6"  (1.676 m)      Body mass index is 27.76 kg/(m^2).  Constitutional: She is oriented to person, place, and time. She appears well-developed and well-nourished. No distress.   Neck: No mass and no thyromegaly present.   Cardiovascular: Normal rate, regular rhythm and normal heart sounds.    No murmur heard.  Pulmonary/Chest: Effort and breath sounds normal.   Abdominal: Soft, non-tender.  No distension or masses.    Genitourinary: normal external genitalia, vulva, vagina, cervix, uterus and adnexa.  Breast exam:  breasts appear normal, no suspicious masses, no skin or nipple changes or axillary nodes, no axillary lymphadenopathy, risk and benefit of breast self-exam was discussed.  Neurological: She is alert and oriented to person, place, and time.   Skin: Skin is warm and dry. No rash noted. No erythema.   Psychiatric: She has a normal mood and affect. Her behavior is normal.         Assessment/Plan:  Shantell was seen today for gynecologic exam and welcome to medicare visit.    Diagnoses and all orders for this visit:    Initial Medicare annual wellness visit    Screen for colon cancer  -     COLONOSCOPY (Screening)    Screening for rectal cancer  -     POCT occult blood stool    Screening for osteoporosis  -     MAM Dexa Bone Density Scan; Future    Need for Streptococcus pneumoniae vaccination  -     Pneumococcal conjugate vaccine 13-valent    Need for influenza vaccination  -     INFLUENZA, HIGH DOSE, PF (FLUZONE, HIGH DOSE -  65+)    Need for hepatitis C screening test  -     Hepatitis C Antibody    Encounter for annual routine gynecological examination  -     PAP SMEAR  -     PR CA SCREEN;PELVIC/BREAST EXAM  -     PR OBTAINING SCREEN PAP SMEAR    Essential hypertension  -     lisinopril (PRINIVIL;ZESTRIL) 10 MG tablet; Take 1 tablet by mouth daily  -  CBC Auto Differential  -     POCT Urinalysis no Micro    Hyperlipidemia, unspecified  -     Lipid Panel  -     CBC Auto Differential    Return Fasting 6 months on BP, cholesterol.

## 2015-01-10 NOTE — Progress Notes (Addendum)
Welcome to Medicare Wellness Visit       Michelle Pham  Date of Birth:  Aug 13, 1950    Date of Service:  01/10/2015    Chief Complaint:   Michelle Pham is a 64 y.o. female who presents for Medicare Preventive Physical Examination with Personalized Prevention Plan Services and follow up.    HPI: Here for annual Medicare Wellness and follow up.    Patient Active Problem List   Diagnosis   ??? Primary malignant neoplasm of central portion of left breast in female [facility])   ??? Hyperlipidemia, unspecified   ??? Essential hypertension   ??? ER+ (estrogen receptor positive status)       Allergies   Allergen Reactions   ??? Asa [Aspirin] Other (See Comments)     Causes stomach cramping     ??? Crestor [Rosuvastatin]      Myalgia   ??? Pravastatin      myalgia   ??? Questran [Cholestyramine]      indigestion   ??? Zetia [Ezetimibe] Other (See Comments)     Muscle aches   ??? Zocor [Simvastatin] Other (See Comments)     Muscle aches     Outpatient Prescriptions Marked as Taking for the 01/10/15 encounter (Office Visit) with Quinn Plowman Alyric Parkin, MD   Medication Sig Dispense Refill   ??? anastrozole (ARIMIDEX) 1 MG tablet TAKE ONE TABLET BY MOUTH DAILY 90 tablet 3   ??? Calcium Carbonate-Vit D-Min (CALCIUM 1200) 1200-1000 MG-UNIT CHEW Take by mouth     ??? colesevelam (WELCHOL) 3.75 G PACK Take 3.75 g by mouth daily 30 each 0   ??? lisinopril (PRINIVIL;ZESTRIL) 20 MG tablet Take 1 tablet by mouth daily 90 tablet 1       Past Medical History   Diagnosis Date   ??? Breast cancer ([place])      radiation - left breast cancer   ??? Hyperlipidemia    ??? Hypertension    ??? Skin cancer 2014     right forehead.   ??? Wears glasses      Past Surgical History   Procedure Laterality Date   ??? Breast lumpectomy Left 2014   ??? Appendectomy       as tenager   ??? Cesarean section       x 2   ??? Breast biopsy Left 2014     Family History   Problem Relation Age of Onset   ??? High Blood Pressure Mother    ??? Other Mother      colitis and perforated bowel   ??? Alzheimer's Disease Mother    ??? Irritable  Bowel Syndrome Mother    ??? Alcohol Abuse Father    ??? High Cholesterol Brother    ??? High Blood Pressure Brother    ??? Heart Disease Maternal Uncle      heart attack    ??? Alzheimer's Disease Maternal Grandmother    ??? High Blood Pressure Maternal Grandmother    ??? Heart Disease Maternal Grandfather      heart attack    ??? Heart Disease Maternal Uncle      heart attack    ??? Anxiety Disorder Daughter    ??? Other Paternal Grandmother      die of perforated bowel   ??? High Blood Pressure Brother    ??? High Cholesterol Brother      Social History     Social History   ??? Marital status: Married     Spouse name: N/A   ???  Number of children: N/A   ??? Years of education: N/A     Occupational History   ??? Not on file.     Social History Main Topics   ??? Smoking status: Never Smoker   ??? Smokeless tobacco: Never Used   ??? Alcohol use No      Comment:  seldom   ??? Drug use: No   ??? Sexual activity: Yes     Partners: Male     Other Topics Concern   ??? Not on file     Social History Narrative       Consultants:   Patient Care Team:  Vella Redhead, MD as PCP - General (Internal Medicine)  Glynda Jaeger, MD as PCP - Hematology/Oncology (Medical Oncology)  Elenore Paddy, MD (Inactive) as Consulting Physician (Medical Oncology)  Riki Sheer, MD as Surgeon (General Surgery)    [place] form completed by patient, reviewed and scanned into chart.  Summary of HRA, and behavioral, psychosocial, cognitive and functional/safety screening results are documented in additional note within this encounter.    Wt Readings from Last 3 Encounters:   01/10/15 172 lb (78 kg)   11/29/14 173 lb (78.5 kg)   06/08/14 175 lb (79.4 kg)     BP Readings from Last 3 Encounters:   01/10/15 132/84   11/29/14 120/70   06/08/14 138/86       Pertinent Physical Exam    Vitals:    01/10/15 1001   BP: 132/84   Pulse: 72   Resp: 16   Temp: 98.2 ??F (36.8 ??C)   Weight: 172 lb (78 kg)   Height: 5\' 6"  (1.676 m)     Body mass index is 27.76 kg/(m^2).   General Appearance:  alert and oriented to person, place and time, well developed and well- nourished, in no acute distress  Skin: warm and dry, no rash or erythema  Head: normocephalic and atraumatic  Eyes: pupils equal, round, and reactive to light, extraocular eye movements intact, conjunctivae normal  ENT: tympanic membrane, external ear and ear canal normal bilaterally, nose without deformity, nasal mucosa and turbinates normal without polyps  Neck: supple and non-tender without mass, no thyromegaly or thyroid nodules, no cervical lymphadenopathy  Pulmonary/Chest: clear to auscultation bilaterally- no wheezes, rales or rhonchi, normal air movement, no respiratory distress  Cardiovascular: normal rate, regular rhythm, normal S1 and S2, no murmurs, rubs, clicks, or gallops, distal pulses intact, no carotid bruits  Abdomen: soft, non-tender, non-distended, normal bowel sounds, no masses or organomegaly  Extremities: no cyanosis, clubbing or edema  Musculoskeletal: normal range of motion, no joint swelling, deformity or tenderness  Neurologic: reflexes normal and symmetric, no cranial nerve deficit, gait, coordination and speech normal  Foot exam: Normal bilateral sensation.  No sores/ulcerations.  No toe nail fungus.      Current Health Maintenance Status  Health Maintenance   Topic Date Due   ??? Hepatitis C screen  Aug 08, 1950   ??? HIV screen  11/26/1964   ??? Cervical cancer screen  02/05/2014   ??? Flu vaccine (1) 10/30/2014   ??? Colon cancer screen colonoscopy  11/24/2014   ??? PNEUMO VAC >= 65 (1 of 2 - PCV13) 11/27/2014   ??? DEXA (modify frequency per FRAX score)  11/27/2014   ??? Breast cancer screen  03/08/2015   ??? DTaP/Tdap/Td vaccine (2 - Td) 12/09/2015   ??? Lipid screen  06/08/2019   ??? Zostavax vaccine  Completed  Personalized Preventive Plan   This plan is provided to the patient in written form.       Preventive plan of care for Michelle Pham        01/10/2015           Preventive Measures Status       Recommendations for  screening   Colon Cancer Screen    Screening covered every 10 years  Test recommended and ordered   Breast Cancer Screen   Covered annually  Repeat yearly   Cervical Cancer Screen    Covered every 2 years, annually if high risk  Done today   Osteoporosis Screen   Last DXA scan: 08/2012 Covered every 2 years  Test recommended and ordered   Diabetes Screen  GLUCOSE (mg/dL)   Date Value   06/08/2014 89    Screening covered annually, every 6 months if pre-diabetic  Repeat yearly   Cholesterol Screen  Lab Results   Component Value Date    CHOL 254 (H) 06/08/2014    TRIG 95 06/08/2014    HDL 63 (H) 06/08/2014    LDLCALC 172 (H) 06/08/2014    Screening covered every 5 years   Test recommended and ordered   Aspirin for Cardiovascular Prevention   No Not indicated    Recommended Immunizations    Immunization History   Administered Date(s) Administered   ??? Influenza Virus Vaccine 12/08/2013   ??? Meningococcal Vaccine, unspecified formulation 08/29/2010   ??? Tdap (Boostrix, Adacel) 12/08/2005   ??? Zoster 08/29/2010    Influenza vaccine: recommended every fall, administered today, risks and benefits discussed  Pneumonia vaccine: administered today- risks and benefits discussed  Shingles vaccine: admininstered previously- no need to repeat  Tetanus vaccine: tetanus, diptheria and pertussis vaccine (Tdap) previously administered after age 74- no need to repeat        Risk Factors and Conditions Identified During Visit  Risks Identified Treatment options   Behavioral Risks  ?? None identified   ?? No treatment is necessary   Psychosocial Risks  ?? None identified   ?? No treatment is necessary    Functional/Safety Risks  ?? None identified   ?? No treatment is necessary     Other Recommendations/Plans ??   See an eye specialist every 1-2 years to screen for glaucoma; cataracts, and macular degeneration  ?? See a dentist every 6 months  ?? Always wear a seat belt when traveling in a car  ?? Always wear a helmet when riding a bicycle or  motorcycle  ?? You need 1200-1500 mg of calcium and 1000-2000 IU of vitamin D per day- it is possible to meet your calcium requirement with diet alone, but a vitamin D supplement is usually necessary  ?? Consider making a Living Will and Durable Power of Attorney for Healthcare  ?? Have your blood pressure checked at least once every year  ?? Please review the fall prevention information provided today               Michelle Pham was seen today for gynecologic exam and welcome to medicare visit.    Diagnoses and all orders for this visit:    Initial Medicare annual wellness visit    Screen for colon cancer  -     COLONOSCOPY (Screening)    Screening for rectal cancer  -     POCT occult blood stool    Screening for osteoporosis  -     MAM Dexa Bone Density Scan; Future  Need for Streptococcus pneumoniae vaccination  -     Pneumococcal conjugate vaccine 13-valent    Need for influenza vaccination  -     INFLUENZA, HIGH DOSE, PF (FLUZONE, HIGH DOSE - 65+)    Need for hepatitis C screening test  -     Hepatitis C Antibody    Encounter for annual routine gynecological examination  -     PAP SMEAR  -     PR CA SCREEN;PELVIC/BREAST EXAM  -     PR OBTAINING SCREEN PAP SMEAR    Essential hypertension  -     lisinopril (PRINIVIL;ZESTRIL) 10 MG tablet; Take 1 tablet by mouth daily  -     CBC Auto Differential  -     POCT Urinalysis no Micro    Hyperlipidemia, unspecified  -     Lipid Panel  -     CBC Auto Differential    Return Fasting 6 months on BP, cholesterol.

## 2015-01-10 NOTE — Patient Instructions (Signed)
Preventive plan of care for Corlene Sabia        01/10/2015           Preventive Measures Status       Recommendations for screening   Colon Cancer Screen    Screening covered every 10 years  Test recommended and ordered   Breast Cancer Screen   Covered annually  Repeat yearly   Cervical Cancer Screen    Covered every 2 years, annually if high risk  Done today   Osteoporosis Screen   Last DXA scan: 08/2012 Covered every 2 years  Test recommended and ordered   Diabetes Screen  GLUCOSE (mg/dL)   Date Value   06/08/2014 89    Screening covered annually, every 6 months if pre-diabetic  Repeat yearly   Cholesterol Screen  Lab Results   Component Value Date    CHOL 254 (H) 06/08/2014    TRIG 95 06/08/2014    HDL 63 (H) 06/08/2014    LDLCALC 172 (H) 06/08/2014    Screening covered every 5 years   Test recommended and ordered   Aspirin for Cardiovascular Prevention   No Not indicated    Recommended Immunizations    Immunization History   Administered Date(s) Administered   ??? Influenza Virus Vaccine 12/08/2013   ??? Meningococcal Vaccine, unspecified formulation 08/29/2010   ??? Tdap (Boostrix, Adacel) 12/08/2005   ??? Zoster 08/29/2010    Influenza vaccine: recommended every fall, administered today, risks and benefits discussed  Pneumonia vaccine: administered today- risks and benefits discussed  Shingles vaccine: admininstered previously- no need to repeat  Tetanus vaccine: tetanus, diptheria and pertussis vaccine (Tdap) previously administered after age 70- no need to repeat        Risk Factors and Conditions Identified During Visit  Risks Identified Treatment options   Behavioral Risks  ?? None identified   ?? No treatment is necessary   Psychosocial Risks  ?? None identified   ?? No treatment is necessary    Functional/Safety Risks  ?? None identified   ?? No treatment is necessary     Other Recommendations/Plans ??   See an eye specialist every 1-2 years to screen for glaucoma; cataracts, and macular degeneration  ?? See a dentist  every 6 months  ?? Always wear a seat belt when traveling in a car  ?? Always wear a helmet when riding a bicycle or motorcycle  ?? You need 1200-1500 mg of calcium and 1000-2000 IU of vitamin D per day- it is possible to meet your calcium requirement with diet alone, but a vitamin D supplement is usually necessary  ?? Consider making a Living Will and Durable Power of Attorney for Healthcare  ?? Have your blood pressure checked at least once every year  ?? Please review the fall prevention information provided today               Return Fasting 6 months on BP, cholesterol.

## 2015-01-11 LAB — CBC WITH AUTO DIFFERENTIAL
Basophils %: 1.2 %
Basophils Absolute: 0.1 10*3/uL (ref 0.0–0.2)
Eosinophils %: 2.8 %
Eosinophils Absolute: 0.2 10*3/uL (ref 0.0–0.6)
Hematocrit: 40.2 % (ref 36.0–48.0)
Hemoglobin: 13.5 g/dL (ref 12.0–16.0)
Lymphocytes %: 16.8 %
Lymphocytes Absolute: 1 10*3/uL (ref 1.0–5.1)
MCH: 32.3 pg (ref 26.0–34.0)
MCHC: 33.5 g/dL (ref 31.0–36.0)
MCV: 96.6 fL (ref 80.0–100.0)
MPV: 8.1 fL (ref 5.0–10.5)
Monocytes %: 7.3 %
Monocytes Absolute: 0.4 10*3/uL (ref 0.0–1.3)
Neutrophils %: 71.9 %
Neutrophils Absolute: 4.3 10*3/uL (ref 1.7–7.7)
Platelets: 381 10*3/uL (ref 135–450)
RBC: 4.16 M/uL (ref 4.00–5.20)
RDW: 14.5 % (ref 12.4–15.4)
WBC: 6 10*3/uL (ref 4.0–11.0)

## 2015-01-11 LAB — LIPID PANEL
Cholesterol, Total: 210 mg/dL — ABNORMAL HIGH (ref 0–199)
HDL: 62 mg/dL — ABNORMAL HIGH (ref 40–60)
LDL Calculated: 132 mg/dL — ABNORMAL HIGH (ref <100)
Triglycerides: 78 mg/dL (ref 0–150)
VLDL Cholesterol Calculated: 16 mg/dL

## 2015-01-11 LAB — HEPATITIS C ANTIBODY: Hep C Ab Interp: NONREACTIVE

## 2015-01-12 LAB — HUMAN PAPILLOMAVIRUS (HPV) DNA PROBE THIN PREP HIGH RISK
HPV Genotype 16: NOT DETECTED
HPV Type 18: NOT DETECTED
HPVOH (OTHER TYPES): NOT DETECTED

## 2015-01-16 NOTE — Addendum Note (Signed)
Addended by: Masato Pettie on: 01/16/2015 01:00 PM     Modules accepted: Orders

## 2015-01-16 NOTE — Addendum Note (Signed)
Addended by: Clements Toro on: 01/16/2015 01:06 PM     Modules accepted: Orders

## 2015-02-01 ENCOUNTER — Inpatient Hospital Stay: Admit: 2015-02-01 | Attending: Internal Medicine

## 2015-02-01 DIAGNOSIS — Z13820 Encounter for screening for osteoporosis: Secondary | ICD-10-CM

## 2015-02-01 NOTE — Progress Notes (Signed)
Inform patient:  Bone density is normal

## 2015-04-03 ENCOUNTER — Encounter

## 2015-04-10 ENCOUNTER — Inpatient Hospital Stay: Admit: 2015-04-10 | Attending: Internal Medicine

## 2015-04-10 ENCOUNTER — Encounter

## 2015-04-10 DIAGNOSIS — Z1231 Encounter for screening mammogram for malignant neoplasm of breast: Secondary | ICD-10-CM

## 2015-07-11 ENCOUNTER — Ambulatory Visit: Admit: 2015-07-11 | Discharge: 2015-07-11 | Payer: PRIVATE HEALTH INSURANCE | Attending: Internal Medicine

## 2015-07-11 DIAGNOSIS — E785 Hyperlipidemia, unspecified: Secondary | ICD-10-CM

## 2015-07-11 MED ORDER — LISINOPRIL 10 MG PO TABS
10 | ORAL_TABLET | Freq: Every day | ORAL | 1 refills | Status: DC
Start: 2015-07-11 — End: 2016-01-09

## 2015-07-11 NOTE — Progress Notes (Signed)
Michelle Pham  Date of Birth:  1950-12-28    Date of Service:  07/11/2015    Chief Complaint:      Chief Complaint   Patient presents with   ??? 6 Month Follow-Up     BP, cholesterol       HPI:  Michelle Pham is a 65 y.o.    Treatment Adherence:   Medication compliance: compliant most of the time   Diet compliance: compliant all of the time   Weight trend: increasing   Current exercise: no regular exercise but she's on her feet all day with her yard work   Barriers: back pain   Hypertension: Home blood pressure monitoring: Yes - 120/80 stable on Lisinopril 10 mg 1 qd. She is adherent to a low sodium diet. Patient denies chest pain, shortness of breath, headache, lightheadedness, blurred vision, peripheral edema, palpitations, dry cough and fatigue. Antihypertensive medication side effects: no medication side effects noted. Use of agents associated with hypertension: NSAIDS.   Hyperlipidemia: off Questran 4 g bid due to indigestion and Welchol is too expensive. Unable to take statins due to myalgia. Trying to watch diet and exercise daily.    No results found for: LABA1C, LABMICR  Lab Results   Component Value Date    NA 141 06/08/2014    K 5.0 06/08/2014    CL 100 06/08/2014    CO2 24 06/08/2014    BUN 21 (H) 06/08/2014    CREATININE 0.9 06/08/2014    GLUCOSE 89 06/08/2014    CALCIUM 9.4 06/08/2014     Lab Results   Component Value Date    CHOL 210 01/10/2015    TRIG 78 01/10/2015    HDL 62 01/10/2015    LDLCALC 132 01/10/2015     Lab Results   Component Value Date    ALT 11 06/08/2014    AST 19 06/08/2014     No results found for: TSH, T4FREE  Lab Results   Component Value Date    WBC 6.0 01/10/2015    HGB 13.5 01/10/2015    HCT 40.2 01/10/2015    MCV 96.6 01/10/2015    PLT 381 01/10/2015     No results found for: INR   No results found for: PSA   No results found for: [facility]     Patient Active Problem List   Diagnosis   ??? Primary malignant neoplasm of central portion of left breast in female [facility])   ??? Hyperlipidemia,  unspecified   ??? Essential hypertension   ??? ER+ (estrogen receptor positive status)       Allergies   Allergen Reactions   ??? Asa [Aspirin] Other (See Comments)     Causes stomach cramping     ??? Crestor [Rosuvastatin]      Myalgia   ??? Pravastatin      myalgia   ??? Questran [Cholestyramine]      indigestion   ??? Zetia [Ezetimibe] Other (See Comments)     Muscle aches   ??? Zocor [Simvastatin] Other (See Comments)     Muscle aches     Outpatient Prescriptions Marked as Taking for the 07/11/15 encounter (Office Visit) with Quinn Plowman Marlowe Lawes, MD   Medication Sig Dispense Refill   ??? lisinopril (PRINIVIL;ZESTRIL) 10 MG tablet Take 1 tablet by mouth daily 90 tablet 1   ??? anastrozole (ARIMIDEX) 1 MG tablet TAKE ONE TABLET BY MOUTH DAILY 90 tablet 3   ??? Calcium Carbonate-Vit D-Min (CALCIUM 1200) 1200-1000 MG-UNIT CHEW Take by  mouth           Review of Systems: 14 systems were negative except of what was stated on HPI    Nursing note and vitals reviewed.    Vitals:    07/11/15 0932   BP: 130/84   Pulse: 72   Resp: 16   Weight: 176 lb (79.8 kg)   Height: 5\' 6"  (1.676 m)     Wt Readings from Last 3 Encounters:   07/11/15 176 lb (79.8 kg)   01/10/15 172 lb (78 kg)   11/29/14 173 lb (78.5 kg)     BP Readings from Last 3 Encounters:   07/11/15 130/84   01/10/15 132/84   11/29/14 120/70     Body mass index is 28.41 kg/(m^2).  Constitutional: Patient appears well-developed and well-nourished. No distress.   Head: Normocephalic and atraumatic.   Neck: Normal range of motion. Neck supple. No thyroidmegaly.   Cardiovascular: Normal rate, regular rhythm, normal heart sounds and intact distal pulses.   Pulmonary/Chest: Effort normal and breath sounds normal. No stridor. No respiratory distress. No wheezes and no rales.   Abdominal: Soft. Bowel sounds are normal. No distension and no mass. No tenderness. No rebound and no guarding.   Musculoskeletal: No edema and no tenderness.   Skin: No rash or erythema.  Psychiatric: Normal mood and affect.  Behavior is normal.     Assessment/Plan:  Michelle Pham was seen today for 6 month follow-up.    Diagnoses and all orders for this visit:    Hyperlipidemia, unspecified  -     Lipid Panel    Essential hypertension  -     Comprehensive Metabolic Panel  -     lisinopril (PRINIVIL;ZESTRIL) 10 MG tablet; Take 1 tablet by mouth daily    Primary malignant neoplasm of central portion of left breast in female [facility])    ER+ (estrogen receptor positive status)        Return Medicare Wellness 10/12 or after.

## 2015-07-12 LAB — LIPID PANEL
Cholesterol, Total: 217 mg/dL — ABNORMAL HIGH (ref 0–199)
HDL: 65 mg/dL — ABNORMAL HIGH (ref 40–60)
LDL Calculated: 131 mg/dL — ABNORMAL HIGH (ref <100)
Triglycerides: 107 mg/dL (ref 0–150)
VLDL Cholesterol Calculated: 21 mg/dL

## 2015-07-12 LAB — COMPREHENSIVE METABOLIC PANEL
ALT: 13 U/L (ref 10–40)
AST: 22 U/L (ref 15–37)
Albumin/Globulin Ratio: 1.4 (ref 1.1–2.2)
Albumin: 4.1 g/dL (ref 3.4–5.0)
Alkaline Phosphatase: 73 U/L (ref 40–129)
Anion Gap: 14 (ref 3–16)
BUN: 17 mg/dL (ref 7–20)
CO2: 25 mmol/L (ref 21–32)
Calcium: 9.4 mg/dL (ref 8.3–10.6)
Chloride: 101 mmol/L (ref 99–110)
Creatinine: 1 mg/dL (ref 0.6–1.2)
GFR African American: >60 (ref >60)
GFR Non-African American: 56 — AB (ref >60)
Globulin: 2.9 g/dL
Glucose: 92 mg/dL (ref 70–99)
Potassium: 5.1 mmol/L (ref 3.5–5.1)
Sodium: 140 mmol/L (ref 136–145)
Total Bilirubin: 0.8 mg/dL (ref 0.0–1.0)
Total Protein: 7 g/dL (ref 6.4–8.2)

## 2015-09-27 ENCOUNTER — Ambulatory Visit: Admit: 2015-09-27 | Discharge: 2015-09-27 | Payer: PRIVATE HEALTH INSURANCE

## 2015-09-27 DIAGNOSIS — Z79811 Long term (current) use of aromatase inhibitors: Secondary | ICD-10-CM

## 2015-09-27 LAB — CBC WITH 5 PART DIFFERENTIAL
Basophils %: 0.7 % (ref 0.1–1.2)
Basophils Absolute: 0.1 10*3/uL — ABNORMAL HIGH (ref 0.0–0.1)
Eosinophils %: 3.4 % (ref 0.7–5.8)
Eosinophils Absolute: 0.2 10*3/ulL (ref 0.0–0.4)
Hematocrit: 39.5 % (ref 34.1–44.9)
Hemoglobin: 12.8 g/dL (ref 11.2–15.7)
Lymphocyte %: 18.4 % — ABNORMAL LOW (ref 19.3–51.7)
Lymphocytes: 1.3 10*3/uL (ref 1.2–3.7)
MCH: 32.1 pg (ref 25.6–32.2)
MCHC: 32.4 g/dL (ref 32.2–35.5)
MCV: 99 fL — ABNORMAL HIGH (ref 79.4–94.8)
Monocytes %: 10.3 % (ref 4.7–12.5)
Monocytes Absolute: 0.7 10*3/uL (ref 0.2–0.9)
Neutrophils %: 67.2 % (ref 34.0–71.1)
Neutrophils Absolute: 4.6 10*3/uL (ref 1.6–6.1)
Platelets: 269 10*3/uL (ref 182.0–369.0)
RBC: 3.99 10*6/uL (ref 3.93–5.22)
RDW: 13 % (ref 11.7–14.4)
WBC: 6.8 10*3/uL (ref 4.0–10.0)

## 2015-09-27 NOTE — Progress Notes (Signed)
This encounter was created in error - please disregard.

## 2015-09-27 NOTE — Progress Notes (Signed)
ONCOLOGY FOLLOW-UP:       Primary Oncologist: Johns  PCP: MONICA Janina Mayo, MD    PROBLEM LIST:       Patient Active Problem List    Diagnosis Date Noted   ??? ER+ (estrogen receptor positive status) 09/14/2014   ??? Hyperlipidemia, unspecified    ??? Essential hypertension    ??? Primary malignant neoplasm of central portion of left breast in female [facility]) 03/12/2012       Primary malignant neoplasm of central portion of left breast in female [facility])    Staging form: Breast, AJCC 7th Edition      Pathologic stage from 04/21/2012: Stage Unknown (T1a, NX, cM0) - Unsigned        Prognostic indicators: ER + PR - Her 2 neg        Oncology History    Breast cancer:  1. Screening mammogram, 02/02/2012, showed increased calcifications in the left subareolar position.    2. Diagnostic mammogram, 02/04/2012, was read as BI-RADS 4.  Because of the anterior position of the abnormality, a stereotactic biopsy could not be performed.    3. Left breast lumpectomy, 04/21/2012, with Dr. Imagene Gurney.  LNs not evaluated.  Pathology showed a 3 mm grade 1 IDC, ER pos, PR neg, HER2 neg, Ki-67 prolif rate low.    4. Adjuvant RT with Dr. Felisa Bonier between 3/4 - 06/28/2012.    5. Started Anastrozole 06/2012.  Plan was for 5 years.      BMD:  1. DEXA, 02/01/2015, showed normal BMD.        Primary malignant neoplasm of central portion of left breast in female [facility])    03/12/2012 Initial Diagnosis     Breast cancer [facility])            INTERVAL HISTORY:       Ms. Michelle Pham is a 65 y.o. female that is being seen today for 3 mm grade 1 IDC of the left breast, ER pos, PR neg, HER2 neg. She is tolerating Arimidex without issues.     No new lumps or bumps.  She feels well. She has many psychosocial stressors right now-her husband has had Parkinson's disease for 25 years and is now falling frequently. She is in the process of selling her house of 37 years to move to [facility] for she and her husband to live with their son who is a Marine scientist and will help care for her husband.      ROS:     ?? Constitutional: Denies fever, sweats, weight loss     ?? Eyes: No visual changes or diplopia. No scleral icterus.  ?? ENT: No Headaches, hearing loss or vertigo. No mouth sores or sore throat.  ?? Cardiovascular: No chest pain, dyspnea on exertion, palpitations or loss of consciousness.   ?? Respiratory: No cough or wheezing, no sputum production. No hemoptysis.  ?? Gastrointestinal: No abdominal pain, appetite loss, blood in stools. No change in bowel habits.  ?? Genitourinary: No dysuria, trouble voiding, or hematuria.  ?? Musculoskeletal: no generalized weakness. No joint complaints.  ?? Integumentary: No rash or pruritis.  ?? Neurological: No headache, diplopia. No change in gait, balance, or coordination. No paresthesias.  ?? Endocrine: No temperature intolerance. No excessive thirst, fluid intake, or urination.   ?? Hematologic/Lymphatic: No abnormal bruising or ecchymoses, blood clots or swollen lymph nodes.  ?? Allergic/Immunologic: No nasal congestion or hives.    CURRENT MEDS:       Current Outpatient Prescriptions  Medication Sig Dispense Refill   ??? lisinopril (PRINIVIL;ZESTRIL) 10 MG tablet Take 1 tablet by mouth daily 90 tablet 1   ??? anastrozole (ARIMIDEX) 1 MG tablet TAKE ONE TABLET BY MOUTH DAILY 90 tablet 3   ??? Calcium Carbonate-Vit D-Min (CALCIUM 1200) 1200-1000 MG-UNIT CHEW Take by mouth       No current facility-administered medications for this visit.        PHYSICAL EXAM:       BP 120/80 (Site: Right Arm, Position: Sitting, Cuff Size: Medium Adult)   Pulse 72   Temp 98.1 ??F (36.7 ??C) (Oral)    Resp 16   Ht 5' 6"  (1.676 m)   Wt 177 lb (80.3 kg)   BMI 28.57 kg/m2    General appearance: alert and cooperative  Head: Normocephalic, without obvious abnormality, atraumatic  Neck: No palpable lymphadenopathy in supraclavicular or cervical chains  Lungs: Clear to auscultation bilaterally, no audible rales, wheezes or crackles  Heart: Regular rate and rhythm, S1, S2 normal  Breast: Bilateral breast  exam performed. Right breast WNL, Left Breast WNL. No axillary lymphadenopathy   Abdomen: Soft, non-tender; bowel sounds normal; no masses,  no organomegaly  Extremities: without cyanosis, clubbing, edema or asymmetry,FROM, no lymphedema  Skin: No jaundice, purpura or petechiae    LABS:     CBC:  Recent Labs      09/27/15   1340   WBC  6.8   HGB  12.8   HCT  39.5   MCV  99.0*   PLT  269.0       BMP:  No results for input(s): NA, K, CO2, BUN, CREATININE, MG, TSH in the last 720 hours.    Invalid input(s): CHLORIDE, GLU    HEPATIC:  No results for input(s): AST, ALT, ALKPHOS, PROT, ALB, BILITOT, BILIDIR, GGT, URICACID, LDH in the last 720 hours.    TUMOR MARKERS:  No results for input(s): PSA, CEA, CA125, CA2729, CA199 in the last 720 hours.      IMAGING:     Bilateral mammogram (03/07/2014):  Impression   IMPRESSION:   Post lumpectomy changes left breast. ??No evidence malignancy.   ??   BIRADS:   BIRADS - CATEGORY 2   ??   Benign, no evidence of malignancy. ??Normal interval follow-up is recommended   in 12 months.   ??   OVERALL ASSESSMENT - BENIGN   ??   A letter of notification will be sent to the patient regarding the results.   ??   The [employer] recommends annual mammograms for women 40   years and older.   ??     ASSESSMENT AND PLAN:       1. Left-sided grade 1 IDC - Status post a left breast lumpectomy, 04/21/2012, with Dr. Imagene Gurney.  LNs not evaluated.  She had a 3 mm grade 1 IDC, ER pos, PR neg, HER2 neg, Ki-67 prolif rate low.  Adjuvant RT with Dr. Felisa Bonier between 3/4 - 06/28/2012.  Started Anastrozole 06/2012.  Plan has been for 5 years of endocrine therapy.  NED.    2. BMD - DEXA, 01/2015 showed normal BMD.    3. Mammogram 04/2015 with no evidence of malignancy.    4.Continue Arimidex    3. RTO in June, 2018.    Rudean Curt, CNP  Medical Oncology/Hematology    [facility]  [address], [place] [address]  Phone: [phone]  Fax: [phone]

## 2015-12-15 ENCOUNTER — Encounter

## 2016-01-09 ENCOUNTER — Telehealth

## 2016-01-09 MED ORDER — LISINOPRIL 10 MG PO TABS
10 | ORAL_TABLET | Freq: Every day | ORAL | 0 refills | Status: DC
Start: 2016-01-09 — End: 2016-01-30

## 2016-01-09 NOTE — Telephone Encounter (Signed)
[facility] called stating patient is [facility] [place] [facility] and needs her Lisinopril 10 mg prescription sent to [place] [facility] now. Their phone number is [phone] Joylene Igo is [phone] for prescriptions.

## 2016-01-09 NOTE — Telephone Encounter (Signed)
LAST REFILL 07/11/15  LAST AMOUNT 90         1 REFILLS   Last seen 07/11/15  Next office visit   01/30/16

## 2016-01-30 ENCOUNTER — Ambulatory Visit: Admit: 2016-01-30 | Discharge: 2016-01-30 | Payer: PRIVATE HEALTH INSURANCE | Attending: Internal Medicine

## 2016-01-30 DIAGNOSIS — Z0000 Encounter for general adult medical examination without abnormal findings: Secondary | ICD-10-CM

## 2016-01-30 MED ORDER — LISINOPRIL 10 MG PO TABS
10 | ORAL_TABLET | Freq: Every day | ORAL | 1 refills | Status: DC
Start: 2016-01-30 — End: 2016-10-14

## 2016-01-30 NOTE — Progress Notes (Signed)
Medicare Annual Wellness Visit  Name: Michelle Pham Today???s Date: 01/30/2016   MRN: [id number] Sex: Female   Age: 65 y.o. Ethnicity: Non-Hispanic/Non Latino   DOB: Nov 23, 1950 Race: Michelle Pham is here for Medicare AWV and Follow-up    She complains of intermittent RUQ abdominal pain for about a year and she's concern about gallbladder disease and wants an ultrasound.  She has been under a lot of stress with moving to [place] and her husband being hospitalized/ill about the same time.    Treatment Adherence:   Medication compliance: compliant most of the time   Diet compliance: compliant all of the time   Weight trend: increasing   Current exercise: no regular exercise but she's on her feet all day with her yard work   Barriers: back pain   Hypertension: Home blood pressure monitoring: Yes - 120/80 stable on Lisinopril 10 mg 1 qd. She is adherent to a low sodium diet. Patient denies chest pain, shortness of breath, headache, lightheadedness, blurred vision, peripheral edema, palpitations, dry cough and fatigue. Antihypertensive medication side effects: no medication side effects noted. Use of agents associated with hypertension: NSAIDS.   Hyperlipidemia: off Questran 4 g bid due to indigestion and Welchol is too expensive. Unable to take statins due to myalgia. Trying to watch diet and exercise daily.    No results found for: LABA1C, LABMICR  Lab Results   Component Value Date    NA 140 07/11/2015    K 5.1 07/11/2015    CL 101 07/11/2015    CO2 25 07/11/2015    BUN 17 07/11/2015    CREATININE 1.0 07/11/2015    GLUCOSE 92 07/11/2015    CALCIUM 9.4 07/11/2015     Lab Results   Component Value Date    CHOL 217 07/11/2015    TRIG 107 07/11/2015    HDL 65 07/11/2015    LDLCALC 131 07/11/2015     Lab Results   Component Value Date    ALT 13 07/11/2015    AST 22 07/11/2015     No results found for: TSH, T4FREE  Lab Results   Component Value Date    WBC 6.8 09/27/2015    HGB 12.8 09/27/2015    HCT 39.5 09/27/2015    MCV  99.0 (H) 09/27/2015    PLT 269.0 09/27/2015     No results found for: INR   No results found for: PSA   No results found for: [place] for behavioral, psychosocial and functional/safety risks, and cognitive dysfunction are all negative except as indicated below. These results, as well as other patient data from the [place] form, are documented in Flowsheets linked to this Encounter.    Allergies   Allergen Reactions   ??? Asa [Aspirin] Other (See Comments)     Causes stomach cramping     ??? Crestor [Rosuvastatin]      Myalgia   ??? Pravastatin      myalgia   ??? Questran [Cholestyramine]      indigestion   ??? Zetia [Ezetimibe] Other (See Comments)     Muscle aches   ??? Zocor [Simvastatin] Other (See Comments)     Muscle aches     Prior to Visit Medications    Medication Sig Taking? Authorizing Provider   lisinopril (PRINIVIL;ZESTRIL) 10 MG tablet Take 1 tablet by mouth daily Yes Jefferie Holston N Kadence Mikkelson, MD   anastrozole (ARIMIDEX) 1 MG tablet TAKE ONE  TABLET BY MOUTH DAILY Yes Shary Decamp, MD   Calcium Carbonate-Vit D-Min (CALCIUM 1200) 1200-1000 MG-UNIT CHEW Take by mouth Yes Historical Provider, MD     Past Medical History:   Diagnosis Date   ??? Breast cancer (HCC)     radiation - left breast cancer   ??? Hyperlipidemia    ??? Hypertension    ??? Skin cancer 2014    right forehead.   ??? Wears glasses      Past Surgical History:   Procedure Laterality Date   ??? APPENDECTOMY      as tenager   ??? BREAST BIOPSY Left 2014   ??? BREAST LUMPECTOMY Left 2014   ??? CESAREAN SECTION      x 2     Family History   Problem Relation Age of Onset   ??? High Blood Pressure Mother    ??? Other Mother      colitis and perforated bowel   ??? Alzheimer's Disease Mother    ??? Irritable Bowel Syndrome Mother    ??? Alcohol Abuse Father    ??? High Cholesterol Brother    ??? High Blood Pressure Brother    ??? Heart Disease Maternal Uncle      heart attack    ??? Alzheimer's Disease Maternal Grandmother    ??? High Blood Pressure Maternal Grandmother    ???  Heart Disease Maternal Grandfather      heart attack    ??? Heart Disease Maternal Uncle      heart attack    ??? Anxiety Disorder Daughter    ??? Other Paternal Grandmother      die of perforated bowel   ??? High Blood Pressure Brother    ??? High Cholesterol Brother        CareTeam (Including outside providers/suppliers regularly involved in providing care):   Patient Care Team:  Michelle Patient Jameir Ake, MD as PCP - General (Internal Medicine)  Shary Decamp, MD as PCP - Hematology/Oncology (Medical Oncology)  Michelle Patient Naz Denunzio, MD as PCP - [facility] Attributed Provider  Rich Number, MD (Inactive) as Consulting Physician (Medical Oncology)  Ardis Hughs, MD as Surgeon (General Surgery)    Wt Readings from Last 3 Encounters:   01/30/16 179 lb (81.2 kg)   09/27/15 177 lb (80.3 kg)   07/11/15 176 lb (79.8 kg)     Vitals:    01/30/16 1039   BP: 128/74   Pulse: 76   Resp: 18   Weight: 179 lb (81.2 kg)   Height: 5\' 6"  (1.676 m)       General Appearance: alert and oriented to person, place and time, well developed and well- nourished, in no acute distress  Skin: warm and dry, no rash or erythema  Head: normocephalic and atraumatic  Eyes: pupils equal, round, and reactive to light, extraocular eye movements intact, conjunctivae normal  ENT: tympanic membrane, external ear and ear canal normal bilaterally, nose without deformity, nasal mucosa and turbinates normal without polyps  Neck: supple and non-tender without mass, no thyromegaly or thyroid nodules, no cervical lymphadenopathy  Pulmonary/Chest: clear to auscultation bilaterally- no wheezes, rales or rhonchi, normal air movement, no respiratory distress  Cardiovascular: normal rate, regular rhythm, normal S1 and S2, no murmurs, rubs, clicks, or gallops, distal pulses intact, no carotid bruits  Abdomen: soft, non-tender, non-distended, normal bowel sounds, no masses or organomegaly  Extremities: no cyanosis, clubbing or edema  Musculoskeletal: normal range of motion, no joint swelling,  deformity or tenderness  Neurologic: reflexes normal and symmetric, no cranial nerve deficit, gait, coordination and speech normal    The following problems were reviewed today and where indicated follow up appointments were made and/or referrals ordered.    Risk Factor Screenings with Interventions:   Fall Risk:  Timed Up and Go Test > 12 seconds?: no  2 or more falls in past year?: no  Fall with injury in past year?: no  Fall Risk Interventions:    ?? None indicated    Depression:  PHQ-2 Score: 0  Depression Interventions:  ?? None indicated    Anxiety:  Anxiety Score: 1  Anxiety Interventions:  ?? None indicated    Cognitive:  Words recalled: 3  Clock Drawing Test (CDT) Score: Normal  Cognitive Impairment Interventions:  ?? None indicated    Substance Abuse:  Social History     Social History Main Topics   ??? Smoking status: Never Smoker   ??? Smokeless tobacco: Never Used   ??? Alcohol use No      Comment:  seldom   ??? Drug use: No   ??? Sexual activity: Yes     Partners: Male     Audit Questionnaire: Screen for Alcohol Misuse  How often do you have a drink containing alcohol?: Monthly or less  How many standard drinks containing alcohol do you have on a typical day when drinking?: One or two  How often do you have six or more drinks on one occasion?: Never  Audit-C Score: 1  During the past year, how often have you found that you were not able to stop drinking once you had started?: Never  During the past year, how often have you failed to do what was normally expected of you because of drinking?: Never  During the past year, how often have you needed a drink in the morning to get yourself going after a heavy drinking session?: Never  During the past year, how often have you had a feeling of guilt or remorse after drinking?: Never  During the past year, have you been unable to remember what happened the night before because you had been drinking?: Never  Have you or someone else been injured as a result of your drinking?:  No  Has a relative or friend, doctor or health worker been concerned about your drinking or suggested you cut down?: No  Total Score: 1  Substance Abuse Interventions:  ?? None indicated    Health Risk Assessment:   General  In general, how would you say your health is?: Very Good  In the past 7 days, have you experienced any of the following?: (!) Stress  Do you get the social and emotional support that you need?: Yes  Do you have a Living Will?: Yes  General Health Risk Interventions:  ?? just moved and remodeling house but stress is improving    Health Habits/Nutrition  Do you exercise for at least 20 minutes 2-3 times per week?: Yes  Have you lost any weight without trying in the past 3 months?: No  Do you eat fewer than 2 meals per day?: No  Have you seen a dentist within the past year?: Yes  Body mass index is 28.89 kg/m??.  Health Habits/Nutrition Interventions:  ?? None indicated    Hearing/Vision  Do you or your family notice any trouble with your hearing?: (!) Yes  Do you have difficulty driving, watching TV, or doing any of your daily activities because of your eyesight?: No  Have you had an eye exam within the past year?: Yes  Hearing/Vision Interventions:  ?? not bad enough to get hearing aids    Safety  Do you have working smoke detectors?: Yes  Have all throw rugs been removed or fastened?: Yes  Do you have non-slip mats in all bathtubs?: Yes  Do all of your stairways have a railing or banister?: Yes  Are your doorways, halls and stairs free of clutter?: Yes  Do you always fasten your seatbelt when you are in a car?: Yes  Safety Interventions:  ?? None indicated    ADLs  In the past 7 days, did you need help from others to perform any of the following everyday activities?: None  In the past 7 days, did you need help from others to take care of any of the following?: None  ADL Interventions:  ?? None indicated    Personalized Preventive Plan   Current Health Maintenance Status  Immunization History    Administered Date(s) Administered   ??? Influenza Virus Vaccine 12/08/2013   ??? Influenza, High Dose 01/10/2015   ??? Meningococcal Vaccine, unspecified formulation 08/29/2010   ??? Pneumococcal 13-valent Conjugate (Prevnar13) 01/10/2015   ??? Pneumococcal Polysaccharide (Pneumovax23) 08/29/2010   ??? Tdap (Boostrix, Adacel) 12/08/2005   ??? Zoster 08/29/2010        Health Maintenance   Topic Date Due   ??? Flu vaccine (1) 11/30/2015   ??? DTaP/Tdap/Td vaccine (2 - Td) 12/09/2015   ??? Pneumococcal low/med risk (2 of 2 - PPSV23) 01/10/2016   ??? Colon Cancer Screen FIT/FOBT  01/10/2016   ??? Breast cancer screen  04/09/2016   ??? Lipid screen  07/10/2020   ??? Zostavax vaccine  Completed   ??? DEXA (modify frequency per FRAX score)  Completed   ??? Hepatitis C screen  Completed     Recommendations for Preventive Services Due: see orders.  Recommended screening schedule for the next 5-10 years is provided to the patient in written form: see Patient Instructions/AVS.    Aolanis was seen today for medicare awv and follow-up.    Diagnoses and all orders for this visit:    Medicare annual wellness visit, subsequent    Need for Streptococcus pneumoniae vaccination  -     Pneumococcal polysaccharide vaccine 23-valent greater than or equal to 2yo subcutaneous/IM    Need for influenza vaccination  -     INFLUENZA, HIGH DOSE, 65 YRS +, IM, PF, PREFILL SYR, 0.5ML (FLUZONE HD)    Essential hypertension  -     lisinopril (PRINIVIL;ZESTRIL) 10 MG tablet; Take 1 tablet by mouth daily    Colicky RUQ abdominal pain  -     US Gallbladder Ruq; Future    Return 6 months on BP if not find PCP.

## 2016-01-30 NOTE — Patient Instructions (Signed)
Personalized Preventive Plan for Michelle Pham - 01/30/2016  Medicare offers a range of preventive health benefits. Some of the tests and screenings are paid in full while other may be subject to a deductible, co-insurance, and/or copay.    Some of these benefits include a comprehensive review of your medical history including lifestyle, illnesses that may run in your family, and various assessments and screenings as appropriate.    After reviewing your medical record and screening and assessments performed today your provider may have ordered immunizations, labs, imaging, and/or referrals for you.  A list of these orders (if applicable) as well as your Preventive Care list are included within your After Visit Summary for your review.    Other Preventive Recommendations:    ?? A preventive eye exam performed by an eye specialist is recommended every 1-2 years to screen for glaucoma; cataracts, macular degeneration, and other eye disorders.  ?? A preventive dental visit is recommended every 6 months.  ?? Try to get at least 150 minutes of exercise per week or 10,000 steps per day on a pedometer .  ?? Order or download the FREE "Exercise & Physical Activity: Your Everyday Guide" from [employer]. Call 559-700-7328 or search [employer] online.  ?? You need 1200-1500 mg of calcium and 1000-2000 IU of vitamin D per day. It is possible to meet your calcium requirement with diet alone, but a vitamin D supplement is usually necessary to meet this goal.  ?? When exposed to the sun, use a sunscreen that protects against both UVA and UVB radiation with an SPF of 30 or greater. Reapply every 2 to 3 hours or after sweating, drying off with a towel, or swimming.  ?? Always wear a seat belt when traveling in a car. Always wear a helmet when riding a bicycle or motorcycle.

## 2016-10-14 ENCOUNTER — Encounter

## 2016-10-14 MED ORDER — LISINOPRIL 10 MG PO TABS
10 MG | ORAL_TABLET | ORAL | 0 refills | Status: AC
Start: 2016-10-14 — End: ?

## 2016-10-14 NOTE — Telephone Encounter (Signed)
.    Requested Prescriptions     Pending Prescriptions Disp Refills   . lisinopril (PRINIVIL;ZESTRIL) 10 MG tablet [Pharmacy Med Name: LISINOPRIL TAB 10MG ] 90 tablet 1     Sig: TAKE 1 TABLET DAILY   LAST REFILL 01/30/16  LAST AMOUNT 90         1 REFILLS   Last seen 01/30/2016  Next office visit   N/a waiting to be seen by new PCP    Patient asks for 30 day supply while she waits for new PCP available through her insurance.

## 2017-07-15 ENCOUNTER — Encounter

## 2017-07-15 NOTE — Telephone Encounter (Signed)
Requested Prescriptions     Pending Prescriptions Disp Refills   ??? lisinopril (PRINIVIL;ZESTRIL) 10 MG tablet [Pharmacy Med Name: LISINOPRIL TAB 10MG ] 90 tablet 0     Sig: TAKE 1 TABLET DAILY       LAST REFILL 10/14/16  QUANTITY 90         REFILLS 0  LAST VISIT 02/05/18  NEXT VISIT n/a find new PCP

## 2017-07-15 NOTE — Telephone Encounter (Signed)
Patient moving/change in PCP

## 2022-11-20 IMAGING — DX HIP 2 VIEWS RIGHT WITH PELVIS
2 series · 3 of 3 positions shown · non-contrast
Comparison: None.

________________________________________________________________________________________________ 
HIP 2 VIEWS RIGHT WITH PELVIS, 11/20/2022 [DATE]: 
CLINICAL INDICATION: Right hip pain.

[AP (1 of 2)]
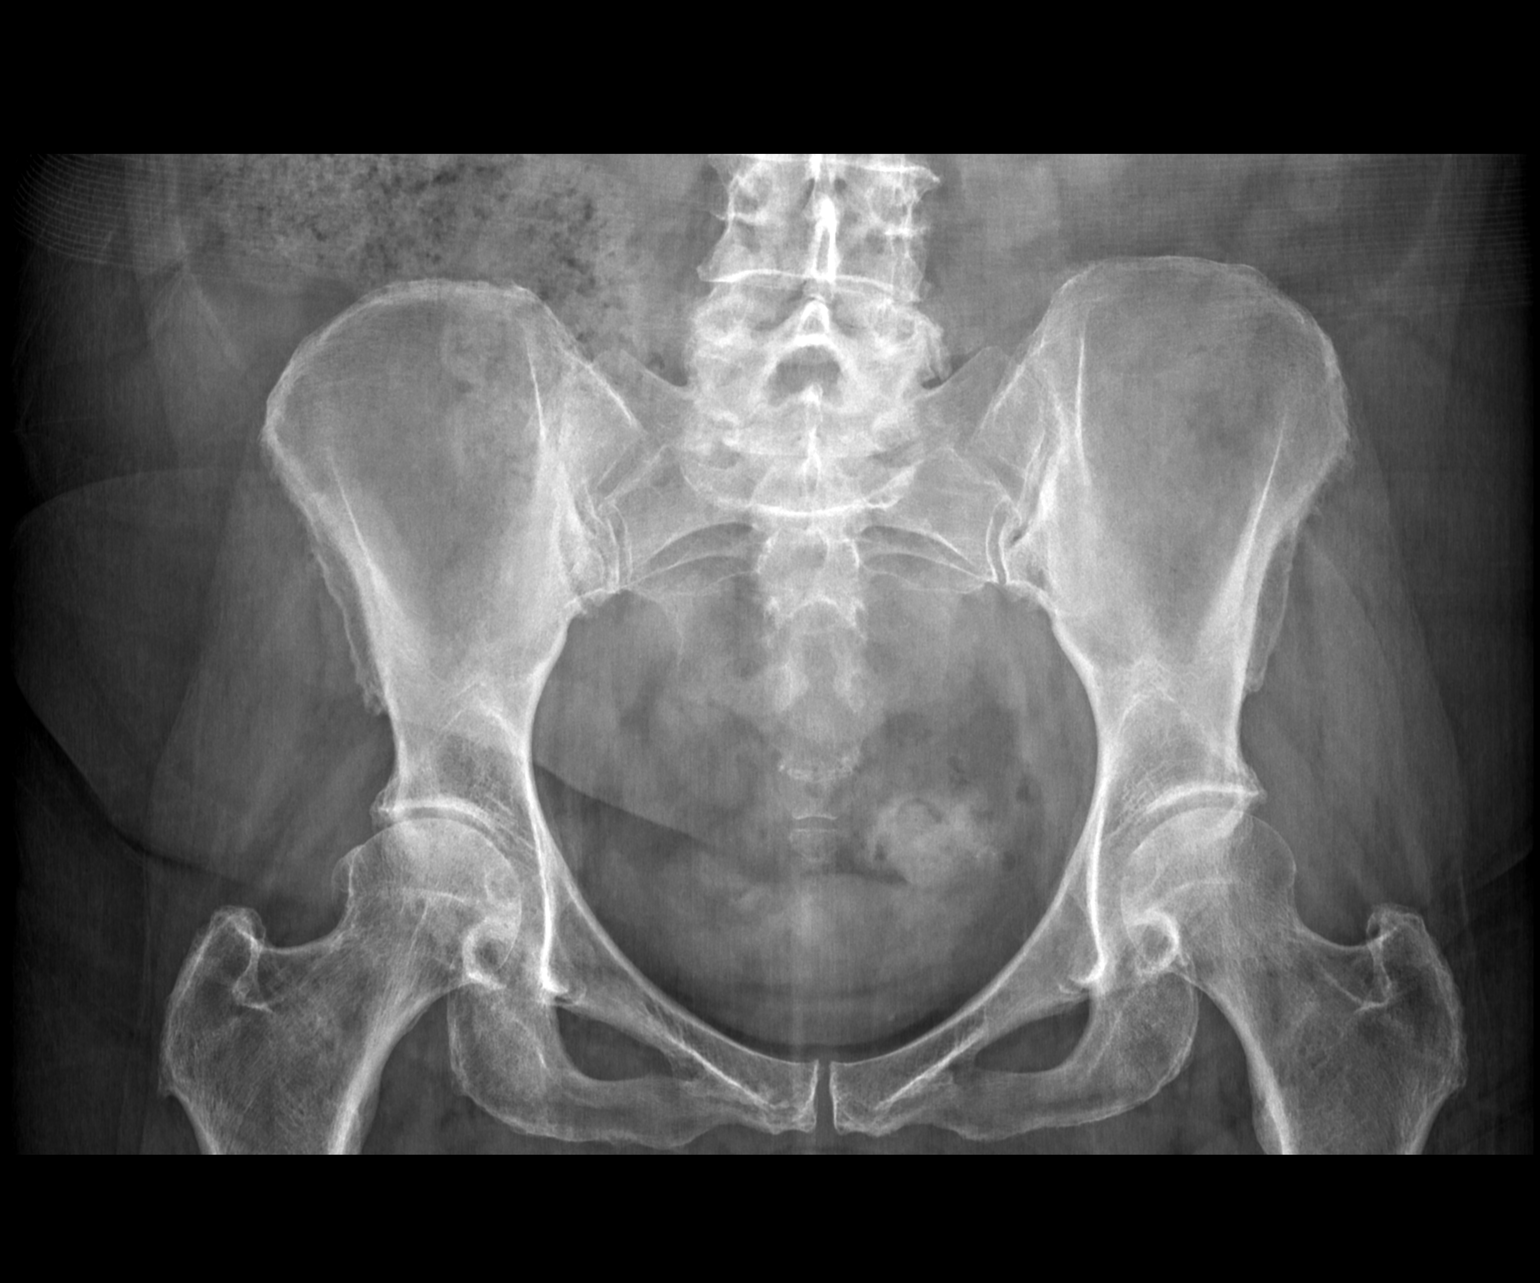

[Series 2: AP · 0.14mm/px · 2 of 2 slices shown (2 of 2)]
[im 1/2]
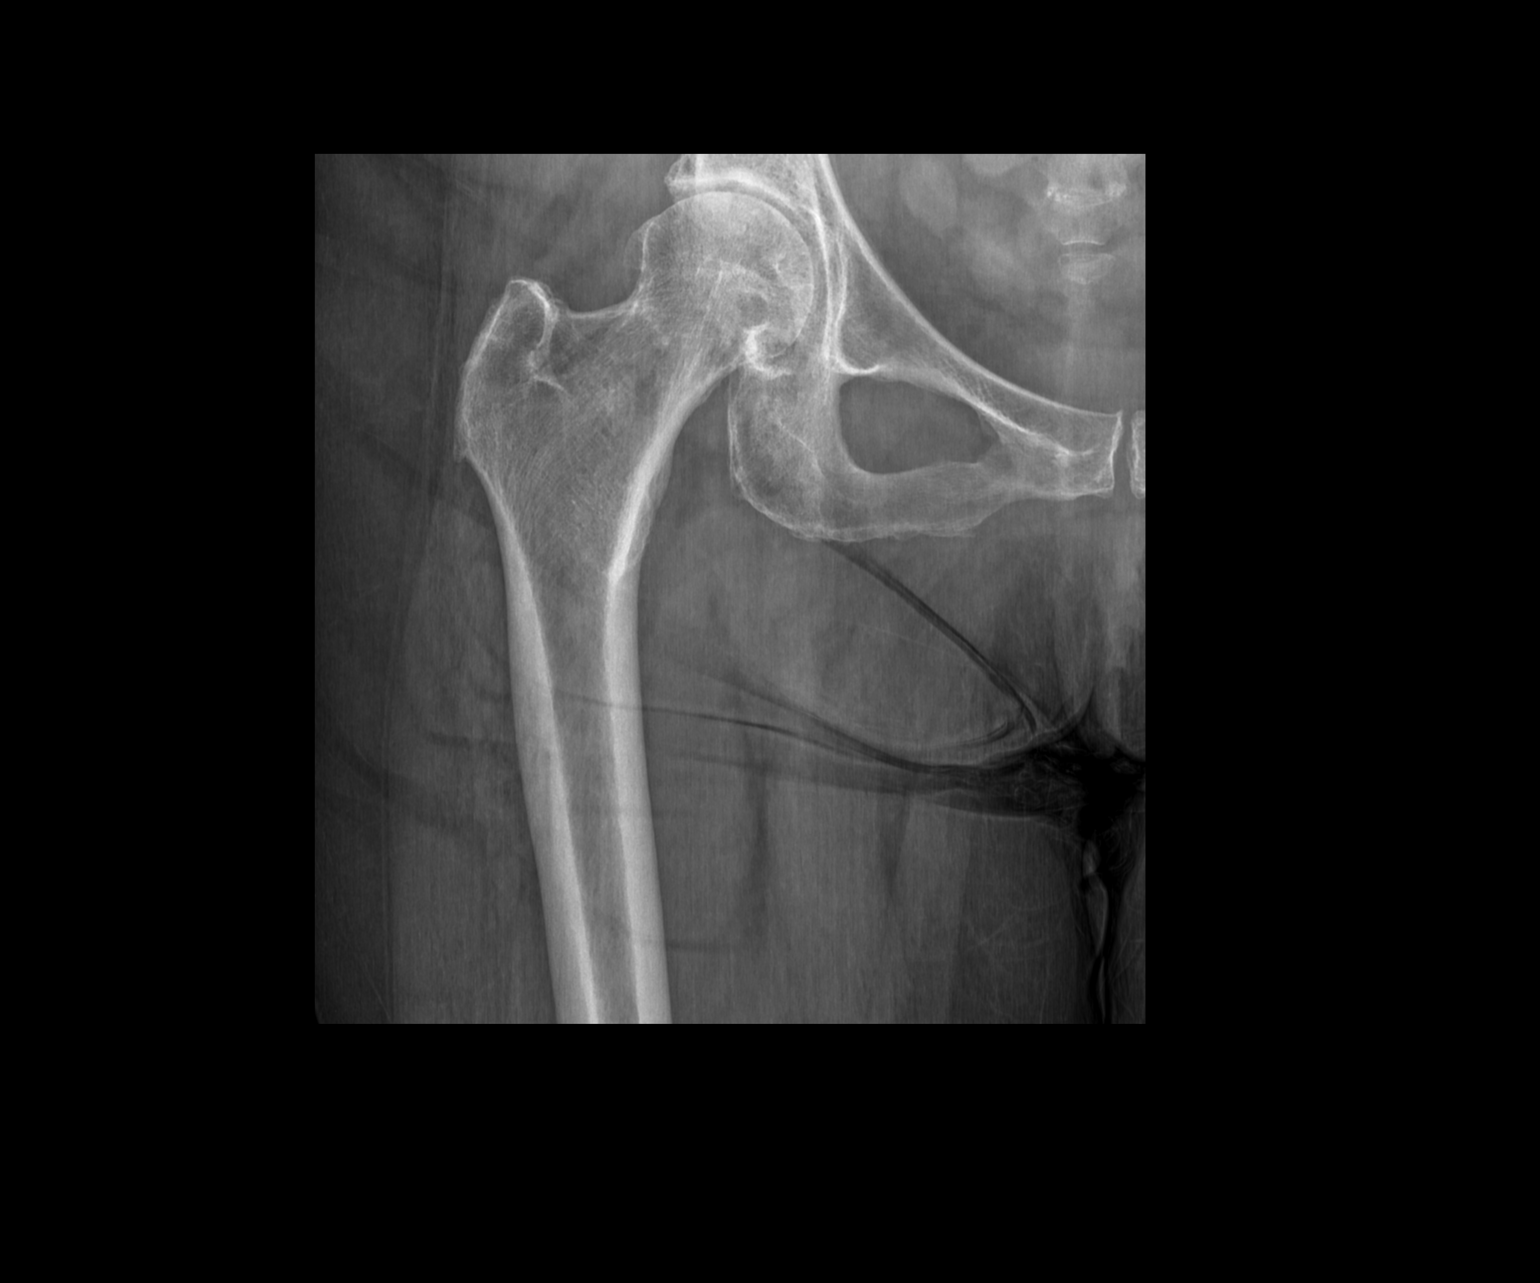
[im 2/2]
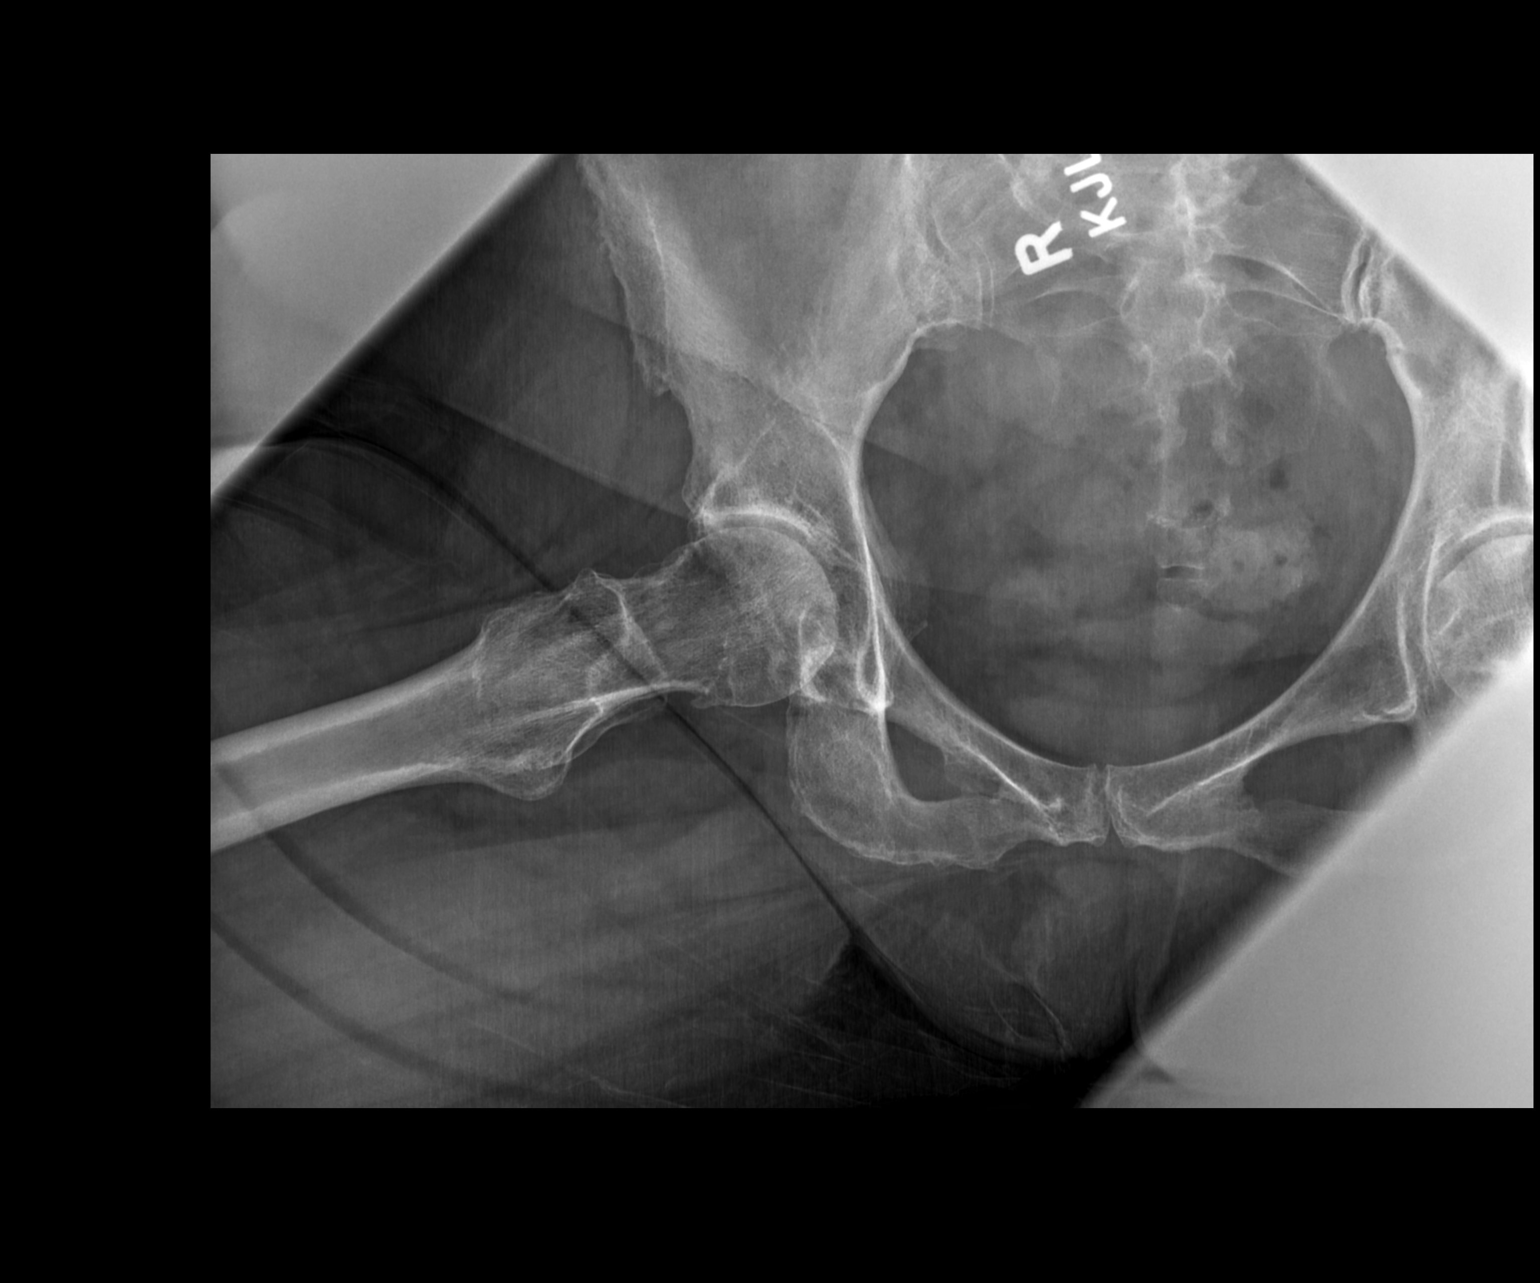

[3 of 3 positions shown; findings below may reference images not displayed]

FINDINGS: No fracture. Normal alignment. Mild to moderate joint space and right 
hip with mild femoral head osteophytic spurring. Degenerative changes included 
lower lumbar spine. SI joints and symphysis pubis are preserved.
IMPRESSION: Degenerative changes including the right hip. If symptoms persist, consideration 
could be made for MR exam.

## 2022-11-30 IMAGING — MR MRI RIGHT HIP WITHOUT CONTRAST
4 of 6 series · 10 of 40 positions shown · IV contrast (gadolinium)
Comparison: 11/20/2022 radiographs

________________________________________________________________________________________________ 
MRI RIGHT HIP WITHOUT CONTRAST, 11/30/2022 [DATE]: 
CLINICAL INDICATION: Pain in right hip
TECHNIQUE: Multiplanar, multiecho position MR images of the pelvis and hip were 
performed without intravenous gadolinium enhancement. Small field-of-view 
imaging was performed of the hip.

[Series 201: survey · axial · 10.0mm · 1.08mm/px · z∈[-33,+207]mm · 2 of 10 slices shown]
[im 1/10]
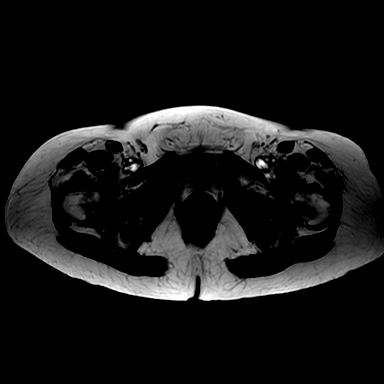
[im 10/10]
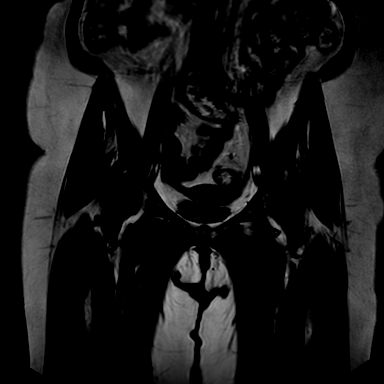

[Series 301: stir_cor-pelvis rl phase · coronal · 5.0mm · 0.60mm/px · 3 of 33 slices shown]
[im 5/33]
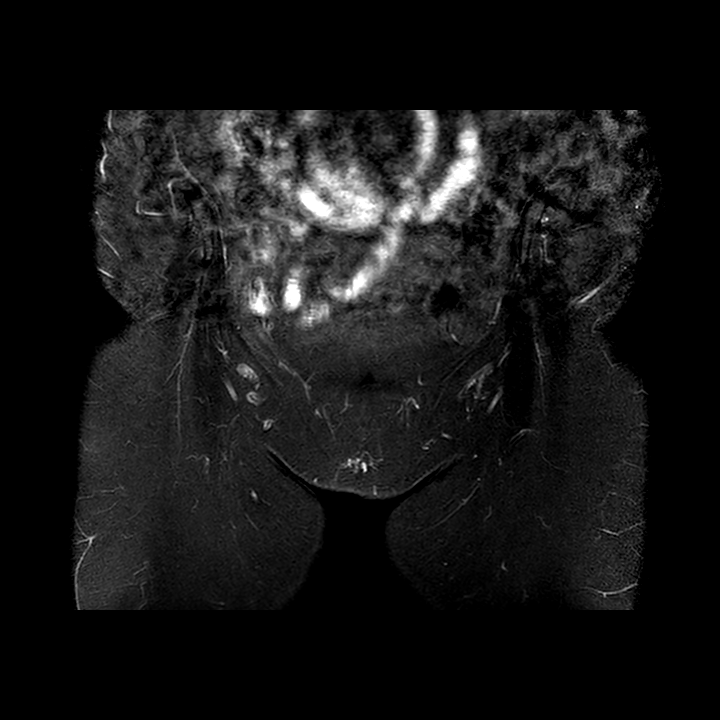
[im 19/33]
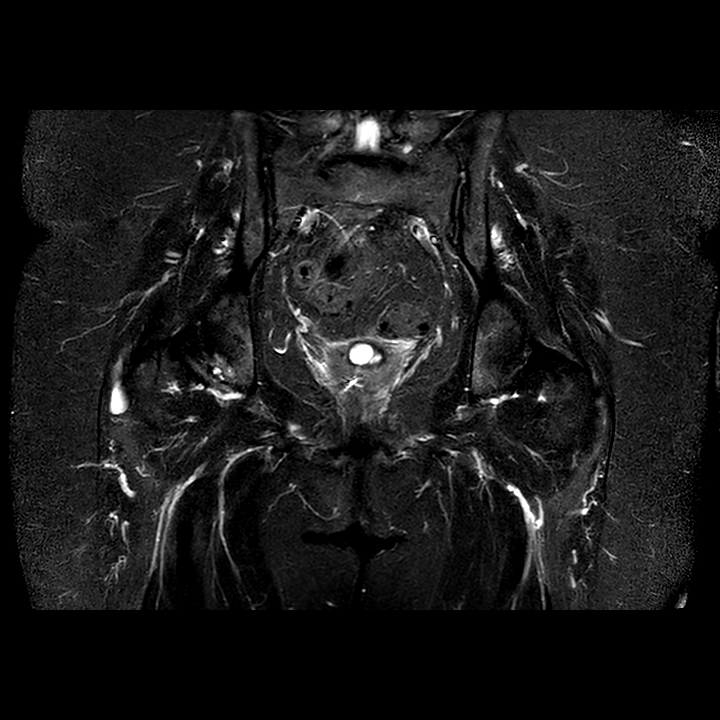
[im 28/33]
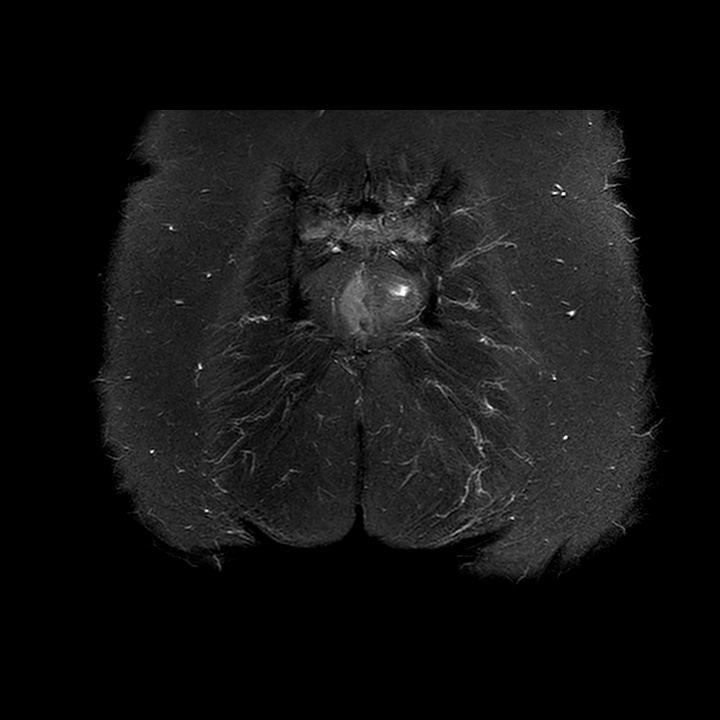

[Series 401: t1_(person_name) · axial · 5.0mm · 0.43mm/px · z∈[-97,+83]mm · 3 of 40 slices shown]
[im 5/40]
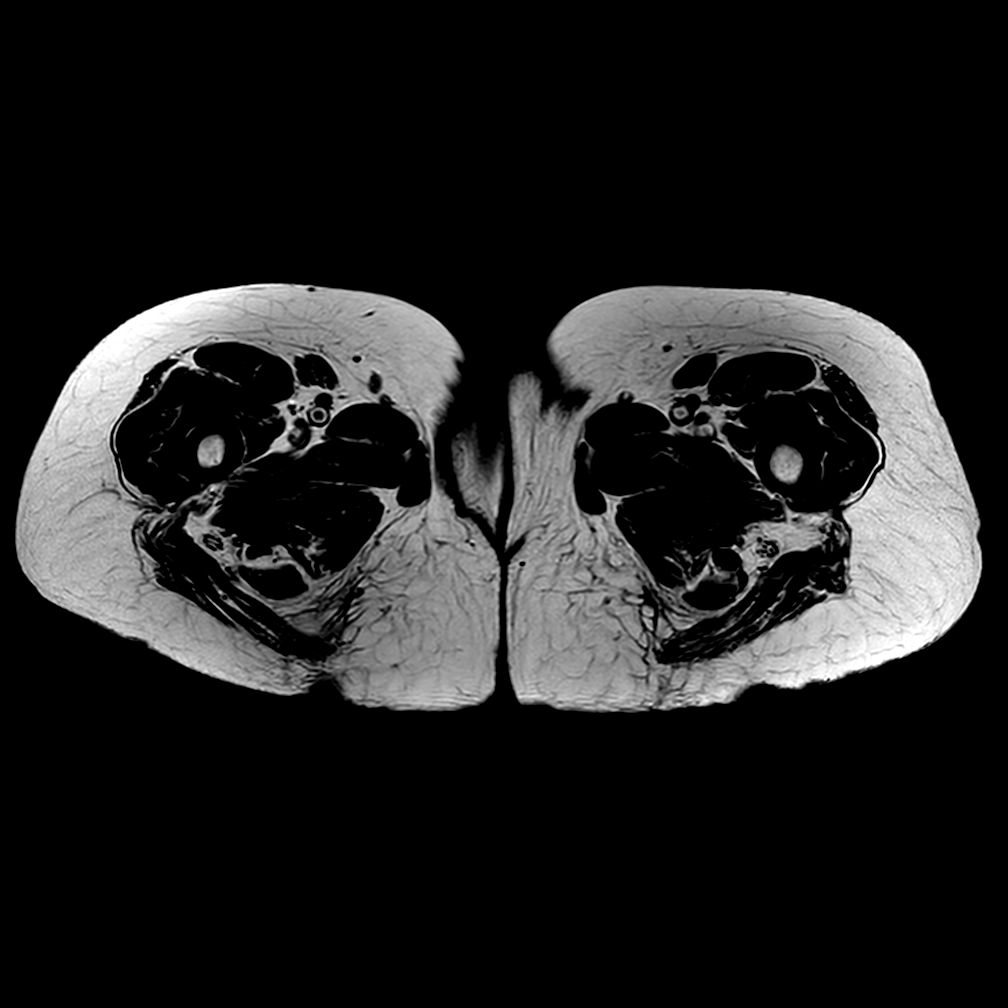
[im 22/40]
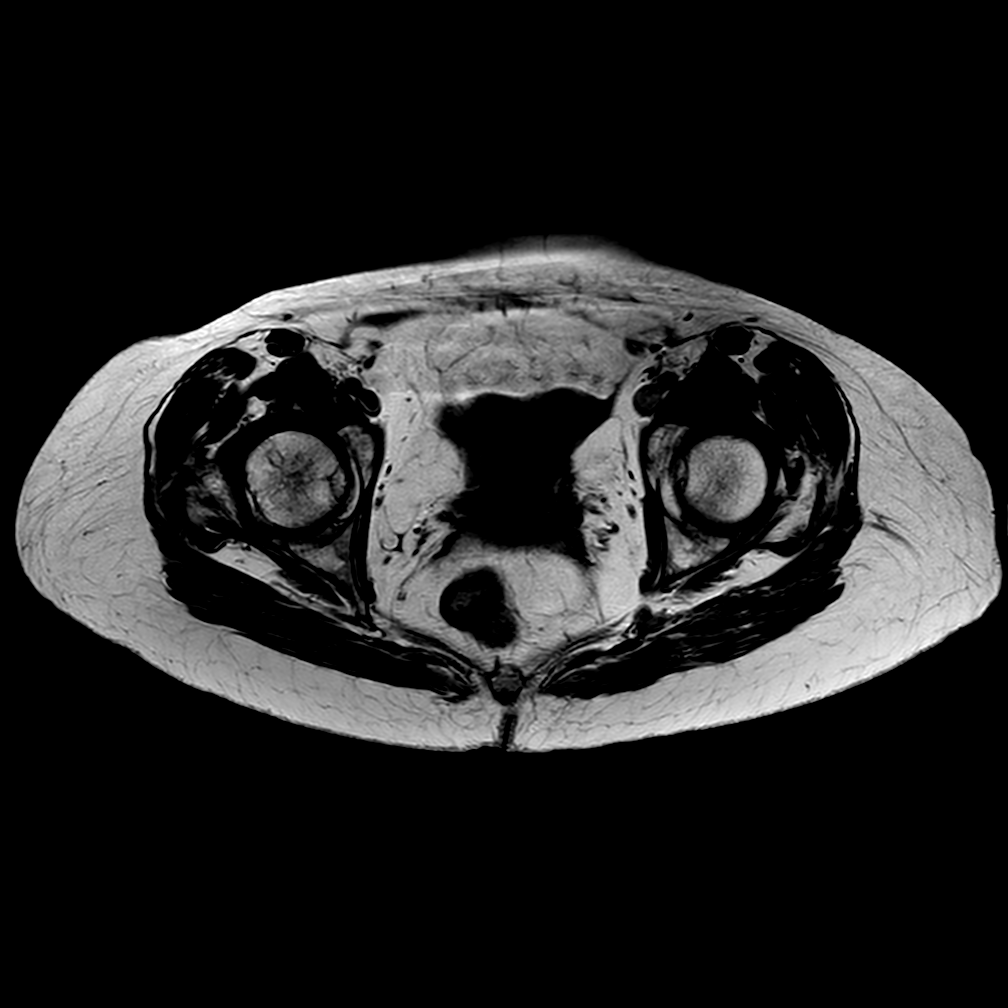
[im 35/40]
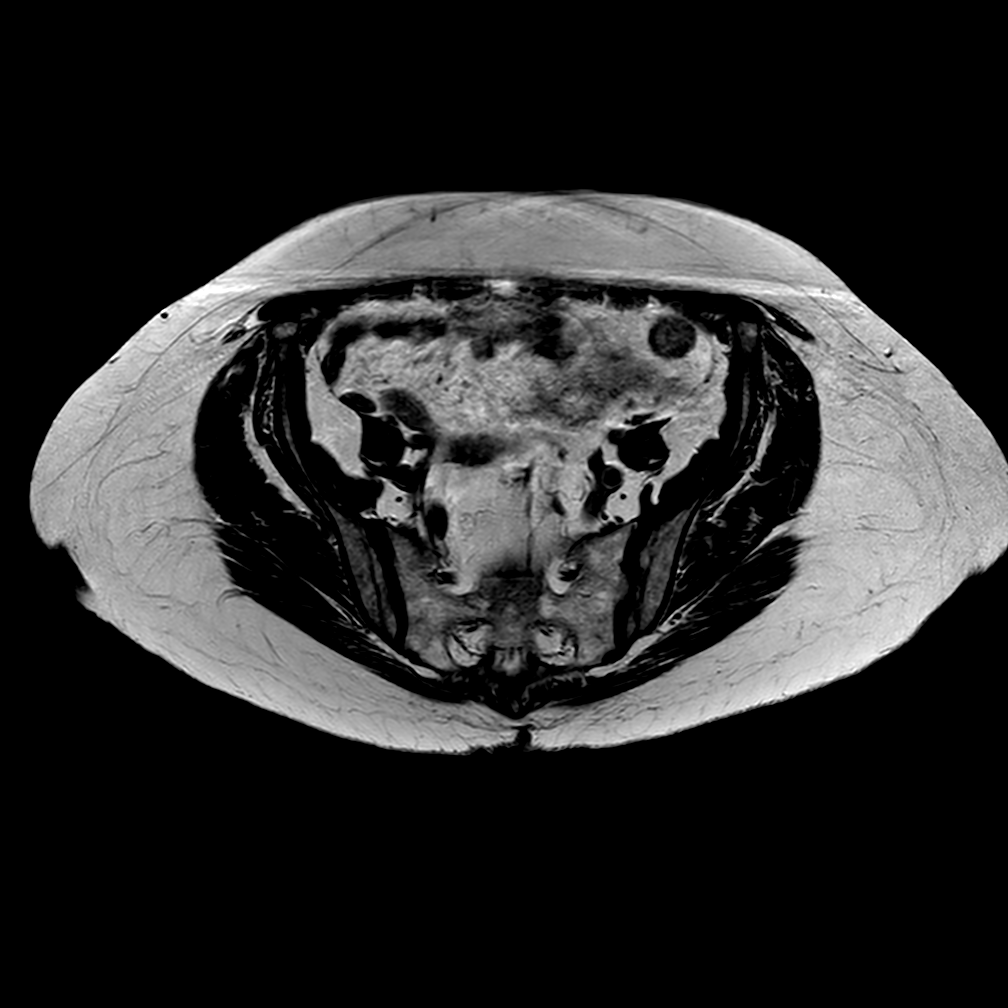

[Series 501: pd_fs_sag ap · sagittal · 4.0mm · 0.55mm/px · 2 of 31 slices shown]
[im 5/31]
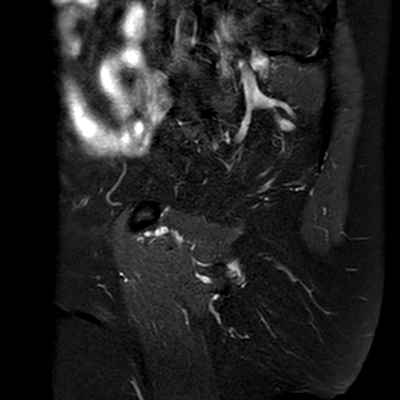
[im 18/31]
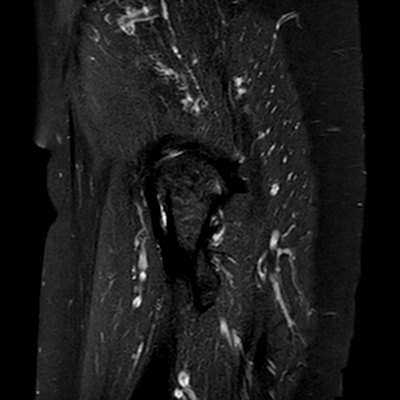

[10 of 40 positions shown; findings below may reference images not displayed]

FINDINGS: HIPS: Moderate right hip degenerative change with partial-thickness and 
full-thickness chondromalacia, subcortical cysts and osteophytes. Degenerative 
right anterior/superior labral tears. No paralabral cyst. No discrete articular 
cartilaginous loss of the left hip. No hip joint effusion. Both femoral heads 
maintain a spherical configuration without evidence of avascular necrosis or 
subarticular collapse. No abnormal morphology of the proximal femurs or 
acetabulum to predispose to impingement. 
PELVIC BONES: Normal marrow signal intensity. No fracture, contusion or marrow 
replacing lesion.  
SI JOINTS: Minimal degenerative change. 
PUBIC SYMPHYSIS: Preserved. 
SPINE: Multilevel degenerative change of the spine. 
SOFT TISSUES: Partial thickness tears and tendinosis of the right gluteus 
medius/minimus tendons with small amount of peritendinous/peritrochanteric 
fluid. Mild left gluteal tendinosis and trace amount of adjacent fluid. Partial 
thickness tears of the right hamstring tendon origin and left sided tendinosis. 
The rectus abdominis-adductor aponeurotic complexes are intact. No mass, free 
fluid or adenopathy. Nabothian cysts (up to 1.4 cm). Colonic diverticulosis. The 
bladder is unremarkable.
IMPRESSION: 1.  Moderate right hip degenerative change and anterior/superior labral tears.  
2.  Partial thickness tears/tendinosis of the right gluteus medius/minimus 
tendons.  
3.  Partial thickness tears of the right hamstring tendon origin. 
4.  Left gluteal/hamstring tendinosis.  
5.  Minimal SI joint degenerative change. 
6.  Multilevel degenerative change of the spine.    
7.  The bladder is unremarkable.

## 2022-11-30 IMAGING — MR MRI LUMBAR SPINE WITHOUT CONTRAST
6 of 8 series · 11 of 48 positions shown · IV contrast (gadolinium)
Comparison: None.

________________________________________________________________________________________________ 
MRI LUMBAR SPINE WITHOUT CONTRAST, 11/30/2022 [DATE]: 
CLINICAL INDICATION: Low back pain with radiation to the right hip and leg for 2 
years.
TECHNIQUE: Multiplanar, multiecho position MR images of the lumbar spine were 
performed without intravenous gadolinium enhancement. Patient was scanned on a 
1.5T magnet

[Series 101: survey · axial · 10.0mm · 1.25mm/px · z∈[-33,+201]mm · 2 of 10 slices shown]
[im 1/10]
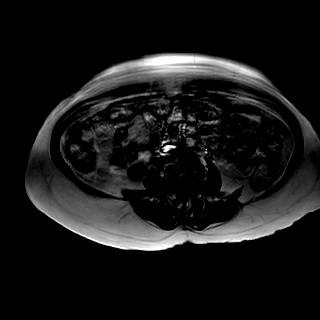
[im 10/10]
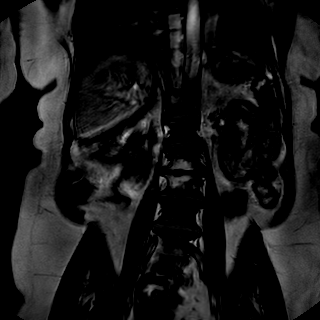

[Series 201: t2w_cor-surv · coronal · 6.0mm · 0.62mm/px · 2 of 10 slices shown]
[im 1/10]
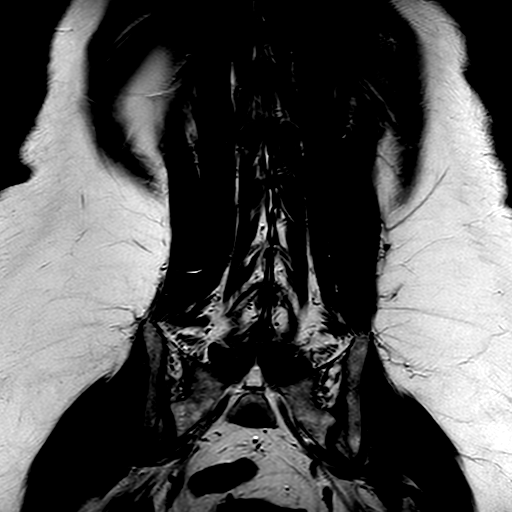
[im 10/10]
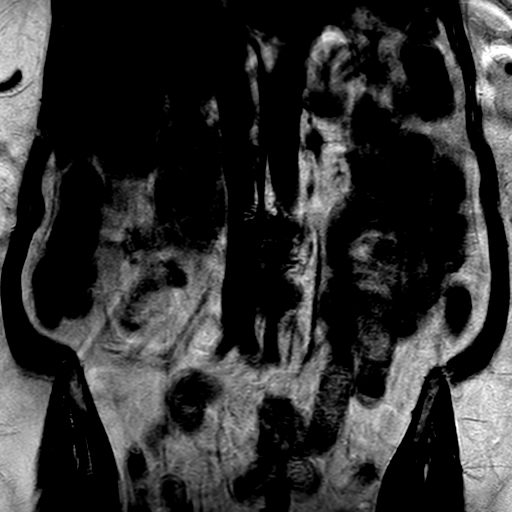

[Series 301: t1_tse_sag · sagittal · 4.0mm · 0.31mm/px · 2 of 19 slices shown]
[im 1/19]
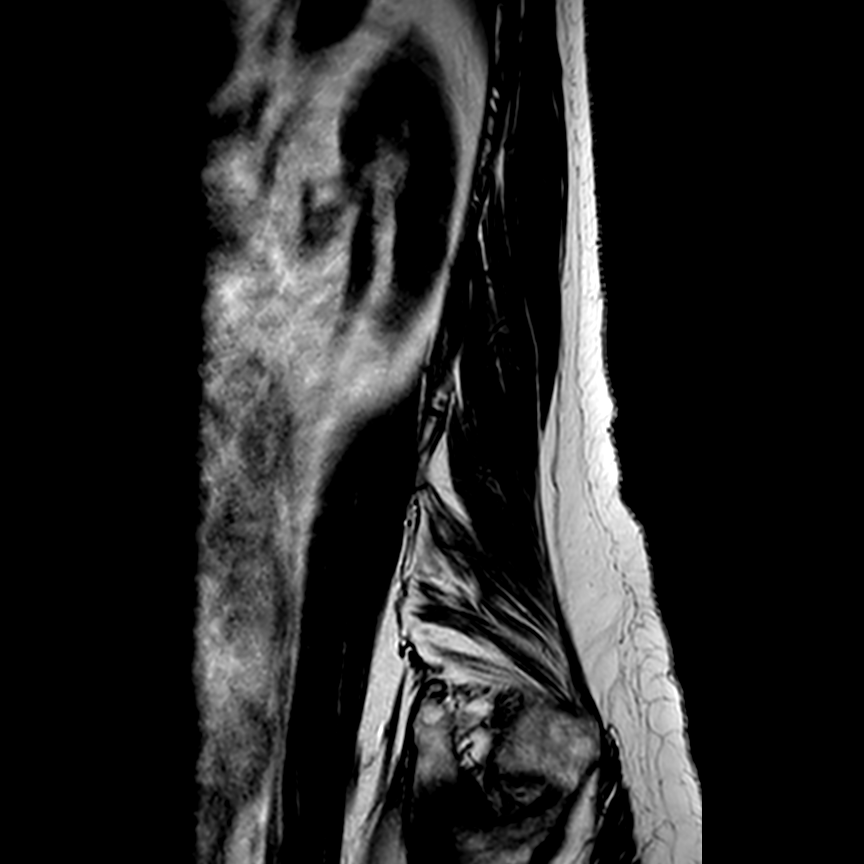
[im 19/19]
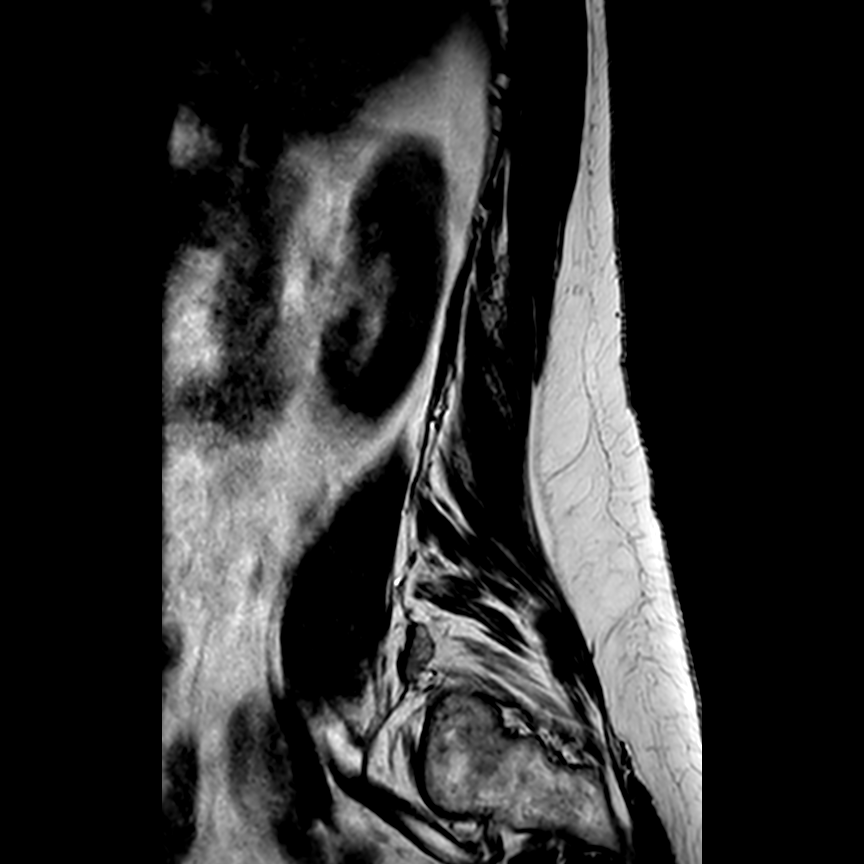

[Series 402: (id)_mdixon_tse · sagittal · 4.0mm · 0.42mm/px · 2 of 19 slices shown]
[im 1/19]
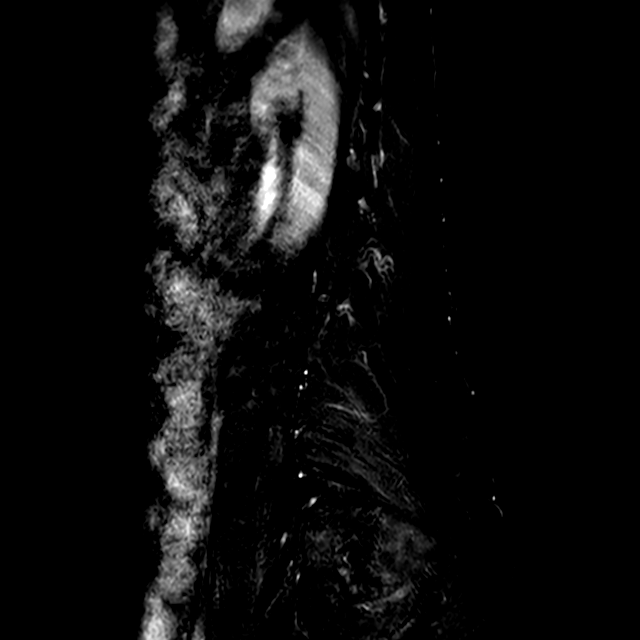
[im 19/19]
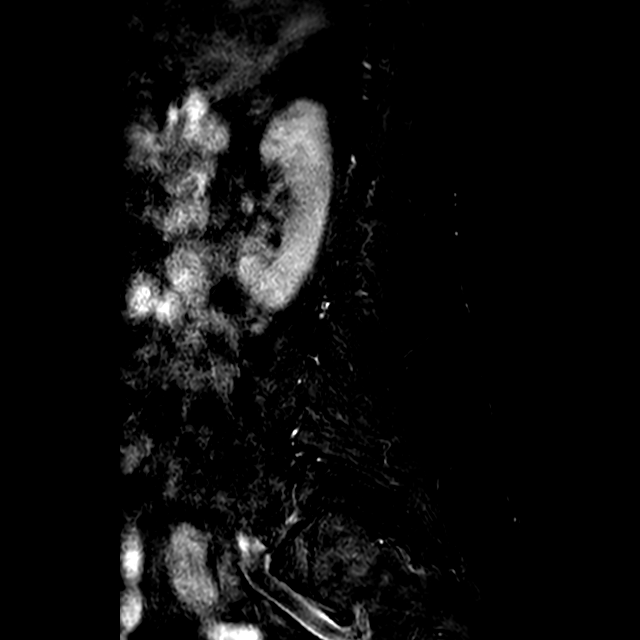

[Series 403: st2w_mdixon_tse · sagittal · 4.0mm · 0.42mm/px · 2 of 19 slices shown]
[im 1/19]
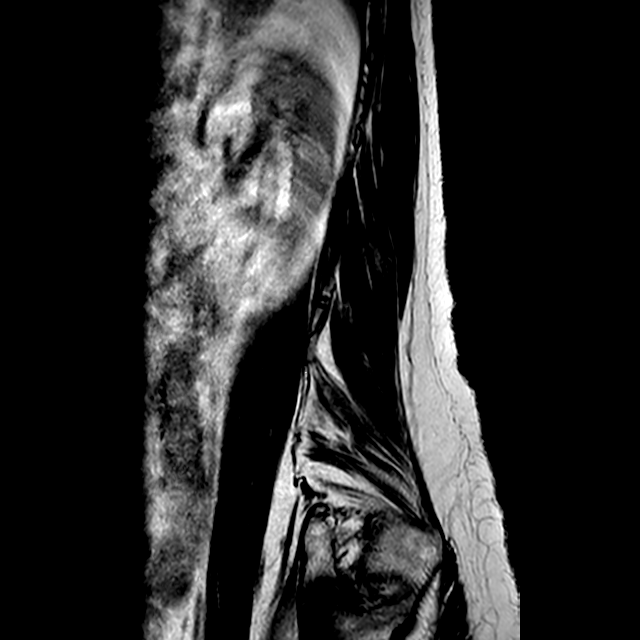
[im 19/19]
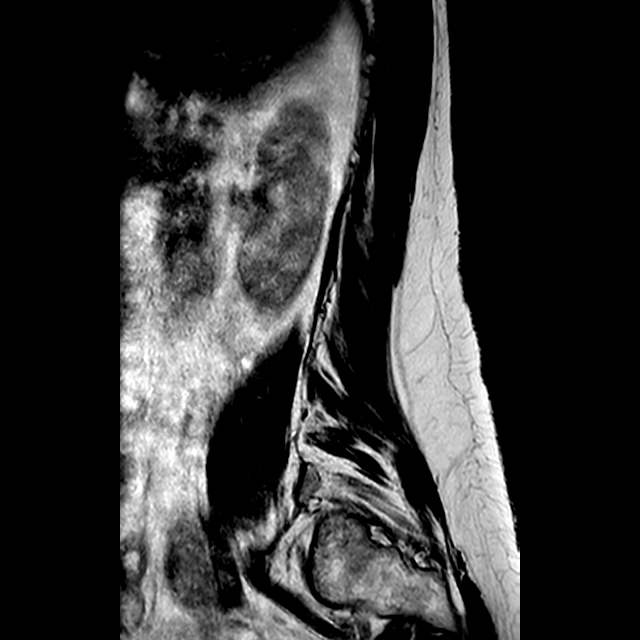

[Series 502: (id) view_ax mpr · axial · 1.0mm · 0.25mm/px · 1 of 149 slices shown]
[im 9/149]
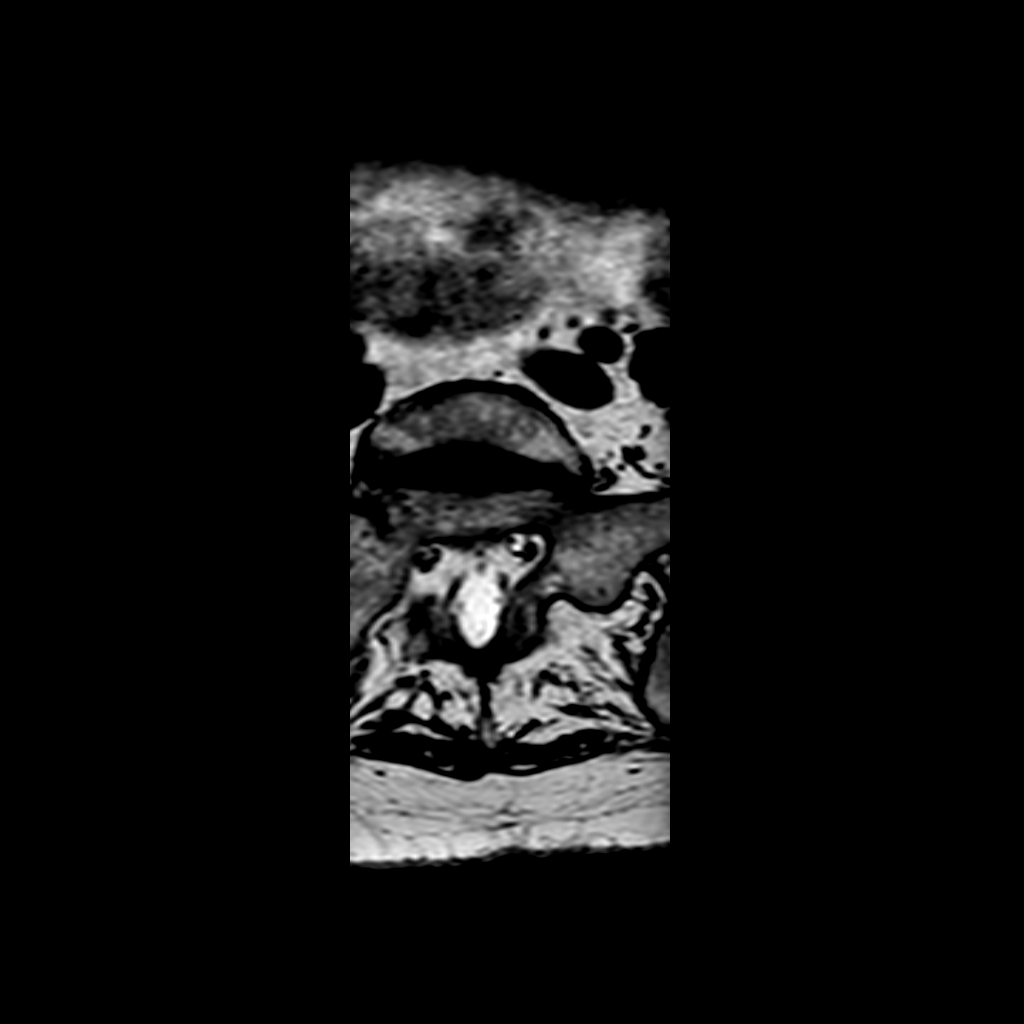

[11 of 48 positions shown; findings below may reference images not displayed]

FINDINGS: -------------------------------------------------------------------------------- 
------ 
GENERAL: 
Nomenclature is based on 5 lumbar type vertebral bodies.     
ALIGNMENT: Leftward curvature of the lumbar spine. Grade 1 retrolisthesis of T11 
on T12, T12 on L1. Grade 1 retrolisthesis of L2 on L3. Grade 1 retrolisthesis of 
L3 on L4. Grade 1 anterolisthesis of L4 on L5. 
VERTEBRAL BODY HEIGHT: Normal.  
MARROW SIGNAL: No focal suspect signal abnormality. 
CORD SIGNAL: Normal distal spinal cord and cauda equina.  Conus terminates at L1 
inferior endplate level. 
ADDITIONAL FINDINGS: None. 
Modic I-II: Modic change at L2-L3, L5-S1. 
Ligamentum Flavum > 2.5 mm: All levels. 
-------------------------------------------------------------------------------- 
------ 
SEGMENTAL: 
T12-L1: Disc bulge eccentric to the right. No significant central canal 
narrowing.  No significant right neural foraminal narrowing. No significant left 
neural foraminal narrowing.  
L1-L2: Trace disc bulge. No significant central canal narrowing.  No significant 
right neural foraminal narrowing. No significant left neural foraminal 
narrowing.  
L2-L3: Loss of disc height with disc bulge and endplate osteophyte production. 
Bilateral facet hypertrophy. Mild central canal narrowing. Severe right and mild 
left subarticular recess narrowing.  Mild right neural foraminal narrowing. Mild 
left neural foraminal narrowing.  
L3-L4: Disc bulge with bilateral facet hypertrophy. No significant central canal 
narrowing. Mild bilateral subarticular recess narrowing.  No significant right 
neural foraminal narrowing. No significant left neural foraminal narrowing.  
L4-L5: Uncovering of the disc space with extensive bilateral facet and 
ligamentum flavum hypertrophy. Severe central canal and bilateral subarticular 
recess narrowing. Obliteration of CSF around the nerve roots.  Mild right neural 
foraminal narrowing. No significant left neural foraminal narrowing.  
L5-S1: Disc bulge with bilateral facet hypertrophy. No significant central canal 
narrowing.  Mild right neural foraminal narrowing. Mild left neural foraminal 
narrowing.  
-------------------------------------------------------------------------------- 
------
IMPRESSION: 1.  Discogenic/degenerative changes as above. 
2.  Severe right subarticular recess narrowing at L2-L3. 
3.  Severe central canal and bilateral subarticular recess narrowing at L4-L5. 
4.  No severe neural foraminal narrowing.
